# Patient Record
Sex: Male | Born: 1940 | Race: White | Hispanic: No | Marital: Married | Smoking: Never smoker
Health system: Southern US, Community
[De-identification: ages and names within clinical notes are randomized; demographics above are authoritative.]

## PROBLEM LIST (undated history)

## (undated) DIAGNOSIS — J189 Pneumonia, unspecified organism: Secondary | ICD-10-CM

## (undated) DIAGNOSIS — J939 Pneumothorax, unspecified: Secondary | ICD-10-CM

## (undated) DIAGNOSIS — I959 Hypotension, unspecified: Secondary | ICD-10-CM

## (undated) DIAGNOSIS — I213 ST elevation (STEMI) myocardial infarction of unspecified site: Secondary | ICD-10-CM

## (undated) DIAGNOSIS — I2510 Atherosclerotic heart disease of native coronary artery without angina pectoris: Secondary | ICD-10-CM

## (undated) HISTORY — PX: CERVICAL SPINE SURGERY: SHX589

---

## 2016-04-29 ENCOUNTER — Emergency Department (HOSPITAL_COMMUNITY): Payer: Medicare Other

## 2016-04-29 ENCOUNTER — Inpatient Hospital Stay (HOSPITAL_COMMUNITY)
Admission: EM | Admit: 2016-04-29 | Discharge: 2016-06-01 | DRG: 870 | Disposition: E | Payer: Medicare Other | Attending: Internal Medicine | Admitting: Internal Medicine

## 2016-04-29 ENCOUNTER — Encounter (HOSPITAL_COMMUNITY): Payer: Self-pay | Admitting: *Deleted

## 2016-04-29 DIAGNOSIS — E46 Unspecified protein-calorie malnutrition: Secondary | ICD-10-CM | POA: Diagnosis not present

## 2016-04-29 DIAGNOSIS — Z515 Encounter for palliative care: Secondary | ICD-10-CM | POA: Diagnosis not present

## 2016-04-29 DIAGNOSIS — I4892 Unspecified atrial flutter: Secondary | ICD-10-CM

## 2016-04-29 DIAGNOSIS — I252 Old myocardial infarction: Secondary | ICD-10-CM

## 2016-04-29 DIAGNOSIS — L89314 Pressure ulcer of right buttock, stage 4: Secondary | ICD-10-CM | POA: Diagnosis not present

## 2016-04-29 DIAGNOSIS — Z9911 Dependence on respirator [ventilator] status: Secondary | ICD-10-CM | POA: Diagnosis not present

## 2016-04-29 DIAGNOSIS — A419 Sepsis, unspecified organism: Secondary | ICD-10-CM | POA: Diagnosis present

## 2016-04-29 DIAGNOSIS — I482 Chronic atrial fibrillation, unspecified: Secondary | ICD-10-CM | POA: Diagnosis present

## 2016-04-29 DIAGNOSIS — I2510 Atherosclerotic heart disease of native coronary artery without angina pectoris: Secondary | ICD-10-CM | POA: Diagnosis present

## 2016-04-29 DIAGNOSIS — J90 Pleural effusion, not elsewhere classified: Secondary | ICD-10-CM | POA: Diagnosis present

## 2016-04-29 DIAGNOSIS — Z6827 Body mass index (BMI) 27.0-27.9, adult: Secondary | ICD-10-CM | POA: Diagnosis not present

## 2016-04-29 DIAGNOSIS — S129XXS Fracture of neck, unspecified, sequela: Secondary | ICD-10-CM

## 2016-04-29 DIAGNOSIS — S129XXA Fracture of neck, unspecified, initial encounter: Secondary | ICD-10-CM | POA: Diagnosis present

## 2016-04-29 DIAGNOSIS — Z7189 Other specified counseling: Secondary | ICD-10-CM | POA: Diagnosis present

## 2016-04-29 DIAGNOSIS — Y95 Nosocomial condition: Secondary | ICD-10-CM | POA: Diagnosis not present

## 2016-04-29 DIAGNOSIS — R000 Tachycardia, unspecified: Secondary | ICD-10-CM | POA: Diagnosis not present

## 2016-04-29 DIAGNOSIS — L89324 Pressure ulcer of left buttock, stage 4: Secondary | ICD-10-CM | POA: Diagnosis not present

## 2016-04-29 DIAGNOSIS — Z930 Tracheostomy status: Secondary | ICD-10-CM | POA: Diagnosis not present

## 2016-04-29 DIAGNOSIS — J151 Pneumonia due to Pseudomonas: Secondary | ICD-10-CM | POA: Diagnosis present

## 2016-04-29 DIAGNOSIS — I5032 Chronic diastolic (congestive) heart failure: Secondary | ICD-10-CM | POA: Diagnosis not present

## 2016-04-29 DIAGNOSIS — J9600 Acute respiratory failure, unspecified whether with hypoxia or hypercapnia: Secondary | ICD-10-CM

## 2016-04-29 DIAGNOSIS — S12500D Unspecified displaced fracture of sixth cervical vertebra, subsequent encounter for fracture with routine healing: Secondary | ICD-10-CM

## 2016-04-29 DIAGNOSIS — Z931 Gastrostomy status: Secondary | ICD-10-CM | POA: Diagnosis not present

## 2016-04-29 DIAGNOSIS — E876 Hypokalemia: Secondary | ICD-10-CM | POA: Diagnosis present

## 2016-04-29 DIAGNOSIS — W131XXD Fall from, out of or through bridge, subsequent encounter: Secondary | ICD-10-CM | POA: Diagnosis present

## 2016-04-29 DIAGNOSIS — S22019A Unspecified fracture of first thoracic vertebra, initial encounter for closed fracture: Secondary | ICD-10-CM | POA: Diagnosis present

## 2016-04-29 DIAGNOSIS — E1165 Type 2 diabetes mellitus with hyperglycemia: Secondary | ICD-10-CM | POA: Diagnosis not present

## 2016-04-29 DIAGNOSIS — D649 Anemia, unspecified: Secondary | ICD-10-CM | POA: Diagnosis not present

## 2016-04-29 DIAGNOSIS — I9589 Other hypotension: Secondary | ICD-10-CM | POA: Diagnosis present

## 2016-04-29 DIAGNOSIS — R6521 Severe sepsis with septic shock: Secondary | ICD-10-CM | POA: Diagnosis not present

## 2016-04-29 DIAGNOSIS — F411 Generalized anxiety disorder: Secondary | ICD-10-CM | POA: Diagnosis present

## 2016-04-29 DIAGNOSIS — A4152 Sepsis due to Pseudomonas: Secondary | ICD-10-CM | POA: Diagnosis not present

## 2016-04-29 DIAGNOSIS — J9621 Acute and chronic respiratory failure with hypoxia: Secondary | ICD-10-CM | POA: Diagnosis present

## 2016-04-29 DIAGNOSIS — G8220 Paraplegia, unspecified: Secondary | ICD-10-CM | POA: Diagnosis present

## 2016-04-29 DIAGNOSIS — J189 Pneumonia, unspecified organism: Secondary | ICD-10-CM

## 2016-04-29 DIAGNOSIS — Z66 Do not resuscitate: Secondary | ICD-10-CM | POA: Diagnosis not present

## 2016-04-29 DIAGNOSIS — R0600 Dyspnea, unspecified: Secondary | ICD-10-CM | POA: Diagnosis present

## 2016-04-29 DIAGNOSIS — S12600D Unspecified displaced fracture of seventh cervical vertebra, subsequent encounter for fracture with routine healing: Secondary | ICD-10-CM

## 2016-04-29 DIAGNOSIS — L89622 Pressure ulcer of left heel, stage 2: Secondary | ICD-10-CM | POA: Diagnosis present

## 2016-04-29 DIAGNOSIS — L8990 Pressure ulcer of unspecified site, unspecified stage: Secondary | ICD-10-CM | POA: Diagnosis present

## 2016-04-29 DIAGNOSIS — L89152 Pressure ulcer of sacral region, stage 2: Secondary | ICD-10-CM | POA: Diagnosis present

## 2016-04-29 HISTORY — DX: ST elevation (STEMI) myocardial infarction of unspecified site: I21.3

## 2016-04-29 HISTORY — DX: Pneumonia, unspecified organism: J18.9

## 2016-04-29 HISTORY — DX: Pneumothorax, unspecified: J93.9

## 2016-04-29 HISTORY — DX: Atherosclerotic heart disease of native coronary artery without angina pectoris: I25.10

## 2016-04-29 HISTORY — DX: Hypotension, unspecified: I95.9

## 2016-04-29 LAB — CBC WITH DIFFERENTIAL/PLATELET
Basophils Absolute: 0 10*3/uL (ref 0.0–0.1)
Basophils Relative: 0 %
EOS PCT: 0 %
Eosinophils Absolute: 0.1 10*3/uL (ref 0.0–0.7)
HEMATOCRIT: 26.5 % — AB (ref 39.0–52.0)
Hemoglobin: 7.9 g/dL — ABNORMAL LOW (ref 13.0–17.0)
LYMPHS ABS: 2.1 10*3/uL (ref 0.7–4.0)
LYMPHS PCT: 13 %
MCH: 25.7 pg — AB (ref 26.0–34.0)
MCHC: 29.8 g/dL — AB (ref 30.0–36.0)
MCV: 86.3 fL (ref 78.0–100.0)
MONO ABS: 0.8 10*3/uL (ref 0.1–1.0)
MONOS PCT: 5 %
NEUTROS ABS: 12.8 10*3/uL — AB (ref 1.7–7.7)
Neutrophils Relative %: 82 %
Platelets: 418 10*3/uL — ABNORMAL HIGH (ref 150–400)
RBC: 3.07 MIL/uL — ABNORMAL LOW (ref 4.22–5.81)
RDW: 15.9 % — AB (ref 11.5–15.5)
WBC: 15.7 10*3/uL — ABNORMAL HIGH (ref 4.0–10.5)

## 2016-04-29 LAB — I-STAT TROPONIN, ED: Troponin i, poc: 0.01 ng/mL (ref 0.00–0.08)

## 2016-04-29 LAB — COMPREHENSIVE METABOLIC PANEL
ALBUMIN: 1.7 g/dL — AB (ref 3.5–5.0)
ALT: 43 U/L (ref 17–63)
ANION GAP: 7 (ref 5–15)
AST: 37 U/L (ref 15–41)
Alkaline Phosphatase: 111 U/L (ref 38–126)
BUN: 25 mg/dL — AB (ref 6–20)
CO2: 33 mmol/L — AB (ref 22–32)
Calcium: 7.9 mg/dL — ABNORMAL LOW (ref 8.9–10.3)
Chloride: 94 mmol/L — ABNORMAL LOW (ref 101–111)
Creatinine, Ser: 0.46 mg/dL — ABNORMAL LOW (ref 0.61–1.24)
GFR calc Af Amer: >60 mL/min (ref >60)
GFR calc non Af Amer: >60 mL/min (ref >60)
GLUCOSE: 194 mg/dL — AB (ref 65–99)
POTASSIUM: 3.9 mmol/L (ref 3.5–5.1)
SODIUM: 134 mmol/L — AB (ref 135–145)
Total Bilirubin: 0.4 mg/dL (ref 0.3–1.2)
Total Protein: 6.5 g/dL (ref 6.5–8.1)

## 2016-04-29 LAB — URINALYSIS, ROUTINE W REFLEX MICROSCOPIC
Bilirubin Urine: NEGATIVE
GLUCOSE, UA: NEGATIVE mg/dL
HGB URINE DIPSTICK: NEGATIVE
KETONES UR: NEGATIVE mg/dL
Nitrite: NEGATIVE
PH: 7 (ref 5.0–8.0)
Protein, ur: 30 mg/dL — AB
Specific Gravity, Urine: 1.029 (ref 1.005–1.030)

## 2016-04-29 LAB — URINE MICROSCOPIC-ADD ON

## 2016-04-29 LAB — I-STAT CG4 LACTIC ACID, ED
Lactic Acid, Venous: 1.61 mmol/L (ref 0.5–1.9)
Lactic Acid, Venous: 1.6 mmol/L (ref 0.5–1.9)

## 2016-04-29 LAB — SEDIMENTATION RATE: Sed Rate: 130 mm/hr — ABNORMAL HIGH (ref 0–16)

## 2016-04-29 LAB — C-REACTIVE PROTEIN: CRP: 9.9 mg/dL — ABNORMAL HIGH (ref <1.0)

## 2016-04-29 MED ORDER — IOPAMIDOL (ISOVUE-370) INJECTION 76%
INTRAVENOUS | Status: AC
  Administered 2016-04-29: 100 mL via INTRAVENOUS
  Filled 2016-04-29: qty 100

## 2016-04-29 MED ORDER — SODIUM CHLORIDE 0.9 % IV BOLUS (SEPSIS)
1000.0000 mL | Freq: Once | INTRAVENOUS | Status: AC
  Administered 2016-04-29: 1000 mL via INTRAVENOUS

## 2016-04-29 MED ORDER — SODIUM CHLORIDE 0.9 % IV BOLUS (SEPSIS)
500.0000 mL | Freq: Once | INTRAVENOUS | Status: AC
  Administered 2016-04-29: 500 mL via INTRAVENOUS

## 2016-04-29 MED ORDER — PIPERACILLIN-TAZOBACTAM 3.375 G IVPB
3.3750 g | Freq: Three times a day (TID) | INTRAVENOUS | Status: DC
Start: 2016-04-30 — End: 2016-05-04
  Administered 2016-04-30 – 2016-05-04 (×13): 3.375 g via INTRAVENOUS
  Filled 2016-04-29 (×16): qty 50

## 2016-04-29 MED ORDER — NOREPINEPHRINE BITARTRATE 1 MG/ML IV SOLN
0.0000 ug/min | Freq: Once | INTRAVENOUS | Status: AC
  Administered 2016-04-29: 10 ug/min via INTRAVENOUS
  Filled 2016-04-29: qty 4

## 2016-04-29 MED ORDER — VANCOMYCIN HCL IN DEXTROSE 1-5 GM/200ML-% IV SOLN
1000.0000 mg | Freq: Two times a day (BID) | INTRAVENOUS | Status: AC
  Administered 2016-04-30 – 2016-05-01 (×4): 1000 mg via INTRAVENOUS
  Filled 2016-04-29 (×6): qty 200

## 2016-04-29 MED ORDER — PIPERACILLIN-TAZOBACTAM 3.375 G IVPB 30 MIN
3.3750 g | Freq: Once | INTRAVENOUS | Status: AC
  Administered 2016-04-29: 3.375 g via INTRAVENOUS
  Filled 2016-04-29: qty 50

## 2016-04-29 MED ORDER — VANCOMYCIN HCL IN DEXTROSE 1-5 GM/200ML-% IV SOLN
1000.0000 mg | Freq: Once | INTRAVENOUS | Status: AC
  Administered 2016-04-29: 1000 mg via INTRAVENOUS
  Filled 2016-04-29: qty 200

## 2016-04-29 NOTE — ED Notes (Signed)
Called pharmacy for Levophed

## 2016-04-29 NOTE — ED Notes (Signed)
Patient transported to CT 

## 2016-04-29 NOTE — Progress Notes (Signed)
Pharmacy Antibiotic Note  Scott Lawrence is a 75 y.o. male admitted on 08/07/16 with sepsis.  Pharmacy has been consulted for vancomycin and zosyn dosing.  Pt received vancomycin 1g and zosyn 3.375g IV once in the ED.  Plan: Vancomycin 1g IV every 12 hours.  Goal trough 15-20 mcg/mL. Zosyn 3.375g IV q8h (4 hour infusion).  Monitor culture data, renal function and clinical course VT at SS prn  Weight: 180 lb (81.647 kg)  No data recorded.  No results for input(s): WBC, CREATININE, LATICACIDVEN, VANCOTROUGH, VANCOPEAK, VANCORANDOM, GENTTROUGH, GENTPEAK, GENTRANDOM, TOBRATROUGH, TOBRAPEAK, TOBRARND, AMIKACINPEAK, AMIKACINTROU, AMIKACIN in the last 168 hours.  CrCl cannot be calculated (Unknown ideal weight.).    No Known Allergies  Antimicrobials this admission: Vanc 6/29 >>  Zosyn 6/29 >>   Dose adjustments this admission: n/a  Microbiology results:  BCx:   UCx:    Sputum:    MRSA PCR:    Arlean Hoppingorey M. Newman PiesBall, PharmD, BCPS Clinical Pharmacist Pager [phone] 08/07/16 6:31 PM

## 2016-04-29 NOTE — Progress Notes (Signed)
RT NOTE:  Pt transported to CT and back without complication.

## 2016-04-29 NOTE — ED Provider Notes (Signed)
CSN: [id number]     Arrival date & time 04/05/2016  1802 History   First MD Initiated Contact with Patient 04/01/2016 1805     Chief Complaint  Patient presents with  . Tachycardia     (Consider location/radiation/quality/duration/timing/severity/associated sxs/prior Treatment) HPI Patient presents from facility given hypotension. He does have a history of hypotension secondary to spinal injury. 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 Patient does have G2, trach, and fully. He is trach dependent. He denies any shortness of breath or chest pain but does have this history of STEMI. He does not have good sensation secondary to his cervical cord injury. 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 He is also had multiple pneumonias. The hypotension is severe. Patient received fluids in route. He is on Midodrine for blood pressure but review of records reveals the patient is not typically this hypotensive (70s systolic).  Past Medical History  Diagnosis Date  . Coronary artery disease   . STEMI (ST elevation myocardial infarction) (HCC)   . Pneumothorax   . Hypotension   . Pneumonia    Past Surgical History  Procedure Laterality Date  . Cervical spine surgery     No family history on file. Social History  Substance Use Topics  . Smoking status: Unknown If Ever Smoked  . Smokeless tobacco: None  . Alcohol Use: No    Review of Systems  Constitutional: Negative for fever.  Allergic/Immunologic: Negative for immunocompromised state.  All other systems reviewed and are negative.     Allergies  Review of patient's allergies indicates no known allergies.  Home Medications   Prior to Admission medications   Medication Sig Start Date End Date Taking? Authorizing Provider  acetaminophen (TYLENOL) 500 MG tablet 500 mg by PEG Tube route every 6 (six) hours as needed for mild pain or fever ((FEVER OF >100)).   Yes Historical Provider, MD   Calcium Carbonate-Vitamin D (CALCIUM-VITAMIN D) 500-200 MG-UNIT tablet 2 tablets by PEG Tube route at bedtime.   Yes Historical Provider, MD  chlorhexidine (PERIDEX) 0.12 % solution Use as directed 15 mLs in the mouth or throat 2 (two) times daily. For attention to tracheostomy   Yes Historical Provider, MD  clonazePAM (KLONOPIN) 0.5 MG tablet 0.25 mg by PEG Tube route every 8 (eight) hours as needed (for agitation).   Yes Historical Provider, MD  docusate sodium (COLACE) 100 MG capsule 100 mg by PEG Tube route 2 (two) times daily. HOLD FOR LOOSE STOOLS   Yes Historical Provider, MD  famotidine (PEPCID) 20 MG tablet 20 mg by PEG Tube route 2 (two) times daily.   Yes Historical Provider, MD  fentaNYL (DURAGESIC - DOSED MCG/HR) 25 MCG/HR patch Place 25 mcg onto the skin every 3 (three) days.   Yes Historical Provider, MD  Ferrous Sulfate 5 MG/20ML LIQD 300 mg by PEG Tube route every morning. For supplement   Yes Historical Provider, MD  furosemide (LASIX) 40 MG tablet 40 mg by PEG Tube route every morning.   Yes Historical Provider, MD  Gabapentin 300 MG/6ML SOLN 300 mg by PEG Tube route 3 (three) times daily. FOR PAIN   Yes Historical Provider, MD  guaiFENesin (ROBITUSSIN) 100 MG/5ML liquid 200 mg by PEG Tube route every 6 (six) hours as needed for cough.   Yes Historical Provider, MD  insulin aspart (NOVOLOG) 100 UNIT/ML injection Inject 3-15 Units into the skin 2 (two) times daily. Per sliding scale: "151-200 = 3  units; 201-300 = 5 units; 301-400 = 10 units; 401-500 = 15 units; 501 and up give 20 units and repeat BS every hour <200, subq every 12 hours for dm"   Yes Historical Provider, MD  ipratropium-albuterol (DUONEB) 0.5-2.5 (3) MG/3ML SOLN 3 mLs by Tracheal Tube route every 6 (six) hours. FOR SHORTNESS OF BREATH RELATED TO ACUTE AND CHRONIC RESPIRATORY FAILURE WITH HYPOXIA   Yes Historical Provider, MD  midodrine (PROAMATINE) 10 MG tablet 10 mg by PEG Tube route 3 (three) times daily. HOLD FOR  SBP >100   Yes Historical Provider, MD  oxyCODONE (OXY IR/ROXICODONE) 5 MG immediate release tablet 5 mg by PEG Tube route every 4 (four) hours as needed for moderate pain.   Yes Historical Provider, MD  Sodium Chloride, Inhalant, (HYPER-SAL) 7 % NEBU 1 ampule by Tracheal Tube route every 6 (six) hours. RELATED TO ACUTE AND CHRONIC RESPIRATORY FAILURE WITH HYPOXIA (GIVE WITH DUONEB)   Yes Historical Provider, MD  tamsulosin (FLOMAX) 0.4 MG CAPS capsule 0.4 mg by PEG Tube route every morning.   Yes Historical Provider, MD  UNABLE TO FIND Protein Modular Powder: One scoop via PEG-Tube every six hours   Yes Historical Provider, MD  UNABLE TO FIND Enteral Feed Order- Every 4 hours enteral nutrition via bolus: Full strength Osmolite 1.5 Cal at frequency 260 ml's every four hours   Yes Historical Provider, MD  vitamin C (ASCORBIC ACID) 500 MG tablet 500 mg by PEG Tube route every morning.   Yes Historical Provider, MD  Zinc Sulfate 220 (50 Zn) MG TABS 220 mg by PEG Tube route 2 (two) times daily.   Yes Historical Provider, MD   BP 79/54 mmHg  Pulse 153  Resp 18  Wt 81.647 kg  SpO2 100% Physical Exam  Constitutional: He is oriented to person, place, and time. He appears well-developed and well-nourished. No distress.  HENT:  Head: Normocephalic and atraumatic.  Eyes: Conjunctivae are normal. Pupils are equal, round, and reactive to light. Right eye exhibits no discharge. Left eye exhibits no discharge.  Neck: Normal range of motion. Neck supple.  Cardiovascular: Normal rate and regular rhythm.   No murmur heard. Pulmonary/Chest: Effort normal and breath sounds normal. No respiratory distress.  Abdominal: Soft. Bowel sounds are normal. He exhibits no distension and no mass. There is no tenderness. There is no rebound and no guarding.  Genitourinary:  Foul-smelling sacral decubitus ulcers with purulent discharge Foley with clear urine   Musculoskeletal: He exhibits no edema.  Neurological: He is  alert and oriented to person, place, and time.  Unable to move upper or lower extremities  Skin: Skin is warm. He is not diaphoretic.  Psychiatric: He has a normal mood and affect.  Nursing note and vitals reviewed.   ED Course  Procedures (including critical care time) Labs Review Labs Reviewed  COMPREHENSIVE METABOLIC PANEL - Abnormal; Notable for the following:    Sodium 134 (*)    Chloride 94 (*)    CO2 33 (*)    Glucose, Bld 194 (*)    BUN 25 (*)    Creatinine, Ser 0.46 (*)    Calcium 7.9 (*)    Albumin 1.7 (*)    All other components within normal limits  CBC WITH DIFFERENTIAL/PLATELET - Abnormal; Notable for the following:    WBC 15.7 (*)    RBC 3.07 (*)    Hemoglobin 7.9 (*)    HCT 26.5 (*)    MCH 25.7 (*)    MCHC  29.8 (*)    RDW 15.9 (*)    Platelets 418 (*)    Neutro Abs 12.8 (*)    All other components within normal limits  URINALYSIS, ROUTINE W REFLEX MICROSCOPIC (NOT AT [facility]) - Abnormal; Notable for the following:    Color, Urine AMBER (*)    APPearance CLOUDY (*)    Protein, ur 30 (*)    Leukocytes, UA SMALL (*)    All other components within normal limits  URINE MICROSCOPIC-ADD ON - Abnormal; Notable for the following:    Squamous Epithelial / LPF 0-5 (*)    Bacteria, UA RARE (*)    All other components within normal limits  SEDIMENTATION RATE - Abnormal; Notable for the following:    Sed Rate 130 (*)    All other components within normal limits  C-REACTIVE PROTEIN - Abnormal; Notable for the following:    CRP 9.9 (*)    All other components within normal limits  CULTURE, BLOOD (ROUTINE X 2)  CULTURE, BLOOD (ROUTINE X 2)  URINE CULTURE  TSH  I-STAT CG4 LACTIC ACID, ED  I-STAT TROPOININ, ED  I-STAT CG4 LACTIC ACID, ED  I-STAT TROPOININ, ED    Imaging Review Ct Angio Chest Pe W/cm &/or Wo Cm  04/05/2016  CLINICAL DATA:  Fever. EXAM: CT ANGIOGRAPHY CHEST WITH CONTRAST TECHNIQUE: Multidetector CT imaging of the chest was performed using the  standard protocol during bolus administration of intravenous contrast. Multiplanar CT image reconstructions and MIPs were obtained to evaluate the vascular anatomy. CONTRAST:  100 mL of Isovue 370 COMPARISON:  None. FINDINGS: Tracheostomy tube in place. The trachea and mainstem bronchi are normal. There is partial opacification of the right bronchus intermedius and right lower lobe bronchi. The remainder of the bronchi are normal. No pneumothorax. There is a right-sided pleural effusion with significant underlying opacity. More mild opacity is seen in the left base which could represent atelectasis or another smaller infiltrate. No suspicious nodules or other infiltrates. No overt edema. There is adenopathy in the mediastinum with a representative node anterior to the carina on image 66 measuring 14.5 mm. No left-sided pleural effusion. No pericardial effusion. Coronary artery calcifications are noted. The heart is mildly enlarged. The thoracic aorta is non aneurysmal with no dissection. Mild atherosclerotic changes are seen. Evaluation of right lower lobe pulmonary arteries is limited due to the significant right lower lobe pulmonary opacity. However, within this limitation, no pulmonary emboli are identified. No abnormalities identified on limited views the upper abdomen. A PEG tube is only partially visualized and not well assessed. Visualized bones demonstrate pedicle rods and screws in the cervical spine. Mild anterior wedging of an upper thoracic vertebral body is age-indeterminate but favored to be chronic. A healed old sternal fracture is identified. Review of the MIP images confirms the above findings. IMPRESSION: 1. No pulmonary emboli. 2. Moderate size right-sided pleural effusion with significant underlying opacity. Recommend follow-up to resolution. More mild opacity in the left base as described above. 3. Atherosclerotic change in the thoracic aorta. 4. Adenopathy in the mediastinum is nonspecific but  may be reactive. Recommend attention on follow-up. Electronically Signed   By: Gerome Samavid  Williams III M.D   On: 04/01/2016 23:54   Dg Pelvis Portable  04/23/2016  CLINICAL DATA:  75 year old male with prednisone interval. Evaluate for osteomyelitis. EXAM: PORTABLE PELVIS 1-2 VIEWS COMPARISON:  None. FINDINGS: A linear lucency through the left inferior pubic ramus may be artifactual or represent a nondisplaced fracture. Clinical correlations CT may provide  better evaluation if there is high clinical concern for fracture. The bones are osteopenic. 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 There is no dislocation. There is no bony erosion or periosteal reaction to suggest osteomyelitis. Evaluation for osteomyelitis is however very limited on this single view radiograph. MRI or a white blood cell nuclear scan may provide better evaluation if there is high clinical concern for osteomyelitis. A catheter is noted over the bladder. Round density within the pelvis may send a fecal ball within the rectum or the large prostate gland. IMPRESSION: Artifact versus a nondisplaced fracture of the left inferior pelvic ramus. Clinical correlation is recommended. No other acute findings identified. Electronically Signed   By: Elgie CollardArash  Radparvar M.D.   On: 01/18/2016 21:02   Dg Chest Port 1 View  06-01-2016  CLINICAL DATA:  Tachycardia and hypotension for 1 day EXAM: PORTABLE CHEST 1 VIEW COMPARISON:  None. FINDINGS: There is airspace consolidation throughout the right mid and lower lung zones consistent with pneumonia. There is a small right effusion as well. Left lung is clear. Heart is upper normal in size with pulmonary vascularity within normal limits. No adenopathy. There are foci of atherosclerotic calcification in the aorta. Tracheostomy tip is 6.8 cm above the carina. There is postoperative change in the lower cervical and upper thoracic regions. IMPRESSION: Tracheostomy as described. No pneumothorax. Extensive  airspace consolidation in the right mid and lower lung zones with small right effusion. Left lung is clear. Aortic atherosclerosis. Electronically Signed   By: Bretta BangWilliam  Woodruff III M.D.   On: 01/18/2016 19:23   I have personally reviewed and evaluated these images and lab results as part of my medical decision-making.   EKG Interpretation   Date/Time:  Thursday April 29 2016 18:07:42 EDT Ventricular Rate:  163 PR Interval:    QRS Duration: 83 QT Interval:  250 QTC Calculation: 412 R Axis:   118 Text Interpretation:  Supraventricular tachycardia Right axis deviation  [id number] pattern No acute changes Nonspecific ST and T wave abnormality  Confirmed by NANAVATI, MD, ANKIT [phone]([id number]) on 06-01-2016 11:03:49 PM      MDM   Final diagnoses:  Tachycardia  Atrial flutter, unspecified type (HCC)   New-onset of atrial flutter after adenosine given at bedside. Likely secondary to sepsis from sacral decubitus wounds. Patient started on pressors after systolics in the 7550s. Lactic acid however is stable and not elevated. CTA obtained due to concern of PE but negative. There is fluid collection, possible pneumonia patient has had multiple in the past was drug-resistant organisms. Spoke with cardiology, they recommended the patient be continued on NE and amiodarone. They recommended against cardioversion given the underlying source likely to be sepsis (spoke with nursing home, tachycardia started approx at 2pm today). Initially considered digoxin and neo but after further consideration and the fact the patient is septic, they will like to continue nor NE and amiodarone. Heparin gtt started for CHAD2DS2VASC score of 2 (age, DM).   Attempted to call patient's spouse and son multiple times with no success. Patient authorized us leaving a message in son's voicemail.  Spoke with Dr. Laverna PeaceGilbord. Patient will be admitted to the unit.  Pt now in sinus rhythm. Will dc amiodarone. Heparin gtt continued. Will  admit.   Sidney AceAlison Charruf Socrates Cahoon, MD 04/30/16 [phone]  Derwood KaplanAnkit Nanavati, MD 04/30/16 [phone]

## 2016-04-29 NOTE — ED Notes (Signed)
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

## 2016-04-30 ENCOUNTER — Inpatient Hospital Stay (HOSPITAL_COMMUNITY): Payer: Medicare Other

## 2016-04-30 ENCOUNTER — Encounter (HOSPITAL_COMMUNITY): Payer: Self-pay | Admitting: *Deleted

## 2016-04-30 DIAGNOSIS — I2510 Atherosclerotic heart disease of native coronary artery without angina pectoris: Secondary | ICD-10-CM | POA: Diagnosis present

## 2016-04-30 DIAGNOSIS — M4852XA Collapsed vertebra, not elsewhere classified, cervical region, initial encounter for fracture: Secondary | ICD-10-CM | POA: Diagnosis not present

## 2016-04-30 DIAGNOSIS — L89324 Pressure ulcer of left buttock, stage 4: Secondary | ICD-10-CM | POA: Diagnosis present

## 2016-04-30 DIAGNOSIS — I4892 Unspecified atrial flutter: Secondary | ICD-10-CM | POA: Diagnosis not present

## 2016-04-30 DIAGNOSIS — Z66 Do not resuscitate: Secondary | ICD-10-CM | POA: Diagnosis not present

## 2016-04-30 DIAGNOSIS — J948 Other specified pleural conditions: Secondary | ICD-10-CM | POA: Diagnosis not present

## 2016-04-30 DIAGNOSIS — Z9911 Dependence on respirator [ventilator] status: Secondary | ICD-10-CM | POA: Diagnosis not present

## 2016-04-30 DIAGNOSIS — E46 Unspecified protein-calorie malnutrition: Secondary | ICD-10-CM | POA: Insufficient documentation

## 2016-04-30 DIAGNOSIS — G8220 Paraplegia, unspecified: Secondary | ICD-10-CM | POA: Insufficient documentation

## 2016-04-30 DIAGNOSIS — Z9889 Other specified postprocedural states: Secondary | ICD-10-CM | POA: Insufficient documentation

## 2016-04-30 DIAGNOSIS — R6521 Severe sepsis with septic shock: Secondary | ICD-10-CM | POA: Diagnosis present

## 2016-04-30 DIAGNOSIS — J9611 Chronic respiratory failure with hypoxia: Secondary | ICD-10-CM | POA: Diagnosis not present

## 2016-04-30 DIAGNOSIS — R0600 Dyspnea, unspecified: Secondary | ICD-10-CM | POA: Diagnosis not present

## 2016-04-30 DIAGNOSIS — Z930 Tracheostomy status: Secondary | ICD-10-CM

## 2016-04-30 DIAGNOSIS — S12500D Unspecified displaced fracture of sixth cervical vertebra, subsequent encounter for fracture with routine healing: Secondary | ICD-10-CM | POA: Diagnosis not present

## 2016-04-30 DIAGNOSIS — Z6827 Body mass index (BMI) 27.0-27.9, adult: Secondary | ICD-10-CM | POA: Diagnosis not present

## 2016-04-30 DIAGNOSIS — J9610 Chronic respiratory failure, unspecified whether with hypoxia or hypercapnia: Secondary | ICD-10-CM

## 2016-04-30 DIAGNOSIS — F411 Generalized anxiety disorder: Secondary | ICD-10-CM | POA: Diagnosis not present

## 2016-04-30 DIAGNOSIS — E876 Hypokalemia: Secondary | ICD-10-CM | POA: Diagnosis present

## 2016-04-30 DIAGNOSIS — W131XXD Fall from, out of or through bridge, subsequent encounter: Secondary | ICD-10-CM | POA: Diagnosis present

## 2016-04-30 DIAGNOSIS — J189 Pneumonia, unspecified organism: Secondary | ICD-10-CM | POA: Diagnosis not present

## 2016-04-30 DIAGNOSIS — I340 Nonrheumatic mitral (valve) insufficiency: Secondary | ICD-10-CM | POA: Diagnosis not present

## 2016-04-30 DIAGNOSIS — R000 Tachycardia, unspecified: Secondary | ICD-10-CM | POA: Diagnosis present

## 2016-04-30 DIAGNOSIS — Y95 Nosocomial condition: Secondary | ICD-10-CM | POA: Diagnosis present

## 2016-04-30 DIAGNOSIS — J90 Pleural effusion, not elsewhere classified: Secondary | ICD-10-CM | POA: Diagnosis not present

## 2016-04-30 DIAGNOSIS — J151 Pneumonia due to Pseudomonas: Secondary | ICD-10-CM | POA: Diagnosis present

## 2016-04-30 DIAGNOSIS — Z515 Encounter for palliative care: Secondary | ICD-10-CM | POA: Diagnosis not present

## 2016-04-30 DIAGNOSIS — A4152 Sepsis due to Pseudomonas: Secondary | ICD-10-CM | POA: Diagnosis present

## 2016-04-30 DIAGNOSIS — I252 Old myocardial infarction: Secondary | ICD-10-CM | POA: Diagnosis not present

## 2016-04-30 DIAGNOSIS — A419 Sepsis, unspecified organism: Secondary | ICD-10-CM | POA: Diagnosis not present

## 2016-04-30 DIAGNOSIS — J9621 Acute and chronic respiratory failure with hypoxia: Secondary | ICD-10-CM | POA: Diagnosis present

## 2016-04-30 DIAGNOSIS — Z931 Gastrostomy status: Secondary | ICD-10-CM | POA: Diagnosis not present

## 2016-04-30 DIAGNOSIS — I959 Hypotension, unspecified: Secondary | ICD-10-CM

## 2016-04-30 DIAGNOSIS — L89314 Pressure ulcer of right buttock, stage 4: Secondary | ICD-10-CM | POA: Diagnosis present

## 2016-04-30 DIAGNOSIS — I9589 Other hypotension: Secondary | ICD-10-CM | POA: Diagnosis present

## 2016-04-30 DIAGNOSIS — D649 Anemia, unspecified: Secondary | ICD-10-CM | POA: Diagnosis present

## 2016-04-30 DIAGNOSIS — I482 Chronic atrial fibrillation: Secondary | ICD-10-CM | POA: Diagnosis present

## 2016-04-30 DIAGNOSIS — I5032 Chronic diastolic (congestive) heart failure: Secondary | ICD-10-CM | POA: Diagnosis present

## 2016-04-30 DIAGNOSIS — Z7189 Other specified counseling: Secondary | ICD-10-CM | POA: Diagnosis not present

## 2016-04-30 DIAGNOSIS — E1165 Type 2 diabetes mellitus with hyperglycemia: Secondary | ICD-10-CM | POA: Diagnosis present

## 2016-04-30 LAB — BASIC METABOLIC PANEL
ANION GAP: 5 (ref 5–15)
BUN: 19 mg/dL (ref 6–20)
CALCIUM: 7.5 mg/dL — AB (ref 8.9–10.3)
CO2: 29 mmol/L (ref 22–32)
Chloride: 100 mmol/L — ABNORMAL LOW (ref 101–111)
Creatinine, Ser: 0.36 mg/dL — ABNORMAL LOW (ref 0.61–1.24)
Glucose, Bld: 119 mg/dL — ABNORMAL HIGH (ref 65–99)
POTASSIUM: 3.4 mmol/L — AB (ref 3.5–5.1)
SODIUM: 134 mmol/L — AB (ref 135–145)

## 2016-04-30 LAB — I-STAT ARTERIAL BLOOD GAS, ED
ACID-BASE EXCESS: 8 mmol/L — AB (ref 0.0–2.0)
Bicarbonate: 32.1 mEq/L — ABNORMAL HIGH (ref 20.0–24.0)
O2 Saturation: 93 %
TCO2: 33 mmol/L (ref 0–100)
pCO2 arterial: 44 mmHg (ref 35.0–45.0)
pH, Arterial: 7.472 — ABNORMAL HIGH (ref 7.350–7.450)
pO2, Arterial: 64 mmHg — ABNORMAL LOW (ref 80.0–100.0)

## 2016-04-30 LAB — BLOOD GAS, ARTERIAL
ACID-BASE EXCESS: 6.2 mmol/L — AB (ref 0.0–2.0)
Bicarbonate: 30.8 mEq/L — ABNORMAL HIGH (ref 20.0–24.0)
Drawn by: 41875
FIO2: 0.4
LHR: 16 {breaths}/min
MECHVT: 500 mL
O2 Saturation: 96.9 %
PEEP/CPAP: 5 cmH2O
PO2 ART: 99 mmHg (ref 80.0–100.0)
Patient temperature: 98.6
TCO2: 32.3 mmol/L (ref 0–100)
pCO2 arterial: 50.1 mmHg — ABNORMAL HIGH (ref 35.0–45.0)
pH, Arterial: 7.406 (ref 7.350–7.450)

## 2016-04-30 LAB — GLUCOSE, CAPILLARY
GLUCOSE-CAPILLARY: 110 mg/dL — AB (ref 65–99)
GLUCOSE-CAPILLARY: 114 mg/dL — AB (ref 65–99)
GLUCOSE-CAPILLARY: 117 mg/dL — AB (ref 65–99)
Glucose-Capillary: 123 mg/dL — ABNORMAL HIGH (ref 65–99)
Glucose-Capillary: 116 mg/dL — ABNORMAL HIGH (ref 65–99)
Glucose-Capillary: 113 mg/dL — ABNORMAL HIGH (ref 65–99)

## 2016-04-30 LAB — CBC
HCT: 27.3 % — ABNORMAL LOW (ref 39.0–52.0)
HEMOGLOBIN: 8.2 g/dL — AB (ref 13.0–17.0)
MCH: 26.3 pg (ref 26.0–34.0)
MCHC: 30 g/dL (ref 30.0–36.0)
MCV: 87.5 fL (ref 78.0–100.0)
PLATELETS: 429 10*3/uL — AB (ref 150–400)
RBC: 3.12 MIL/uL — ABNORMAL LOW (ref 4.22–5.81)
RDW: 15.9 % — AB (ref 11.5–15.5)
WBC: 15.2 10*3/uL — ABNORMAL HIGH (ref 4.0–10.5)

## 2016-04-30 LAB — CORTISOL: CORTISOL PLASMA: 16.3 ug/dL

## 2016-04-30 LAB — TSH: TSH: 4.115 u[IU]/mL (ref 0.350–4.500)

## 2016-04-30 LAB — PHOSPHORUS: Phosphorus: 3.2 mg/dL (ref 2.5–4.6)

## 2016-04-30 LAB — MRSA PCR SCREENING: MRSA by PCR: NEGATIVE

## 2016-04-30 LAB — I-STAT TROPONIN, ED: TROPONIN I, POC: 0.04 ng/mL (ref 0.00–0.08)

## 2016-04-30 LAB — PROCALCITONIN: PROCALCITONIN: 0.18 ng/mL

## 2016-04-30 LAB — MAGNESIUM: MAGNESIUM: 1.8 mg/dL (ref 1.7–2.4)

## 2016-04-30 LAB — HEPARIN LEVEL (UNFRACTIONATED)

## 2016-04-30 MED ORDER — MIDODRINE HCL 5 MG PO TABS
10.0000 mg | ORAL_TABLET | Freq: Three times a day (TID) | ORAL | Status: DC
  Administered 2016-04-30 – 2016-05-05 (×16): 10 mg
  Filled 2016-04-30 (×17): qty 2

## 2016-04-30 MED ORDER — PHENYLEPHRINE HCL 10 MG/ML IJ SOLN
0.0000 ug/min | Freq: Once | INTRAVENOUS | Status: DC
  Filled 2016-04-30: qty 1

## 2016-04-30 MED ORDER — SODIUM CHLORIDE 0.9 % IV BOLUS (SEPSIS)
1000.0000 mL | Freq: Once | INTRAVENOUS | Status: AC
  Administered 2016-04-30: 1000 mL via INTRAVENOUS

## 2016-04-30 MED ORDER — HEPARIN BOLUS VIA INFUSION
3000.0000 [IU] | Freq: Once | INTRAVENOUS | Status: AC
  Administered 2016-04-30: 3000 [IU] via INTRAVENOUS
  Filled 2016-04-30: qty 3000

## 2016-04-30 MED ORDER — SODIUM CHLORIDE 0.9 % IV SOLN: Freq: Once | INTRAVENOUS | Status: AC

## 2016-04-30 MED ORDER — HEPARIN BOLUS VIA INFUSION
3000.0000 [IU] | Freq: Once | INTRAVENOUS | Status: AC
Start: 2016-04-30 — End: 2016-04-30
  Administered 2016-04-30: 3000 [IU] via INTRAVENOUS
  Filled 2016-04-30: qty 3000

## 2016-04-30 MED ORDER — AMIODARONE LOAD VIA INFUSION
150.0000 mg | Freq: Once | INTRAVENOUS | Status: DC
  Filled 2016-04-30: qty 83.34

## 2016-04-30 MED ORDER — VITAL AF 1.2 CAL PO LIQD
1000.0000 mL | ORAL | Status: AC
  Administered 2016-04-30 (×2): 1000 mL
  Filled 2016-04-30 (×3): qty 1000

## 2016-04-30 MED ORDER — SODIUM CHLORIDE 0.9 % IV SOLN
INTRAVENOUS | Status: DC
  Administered 2016-04-30 – 2016-05-08 (×8): via INTRAVENOUS

## 2016-04-30 MED ORDER — CHLORHEXIDINE GLUCONATE 0.12% ORAL RINSE (MEDLINE KIT)
15.0000 mL | Freq: Two times a day (BID) | OROMUCOSAL | Status: DC
  Administered 2016-04-30 – 2016-05-09 (×18): 15 mL via OROMUCOSAL

## 2016-04-30 MED ORDER — DIGOXIN 0.25 MG/ML IJ SOLN: 0.5000 mg | Freq: Once | INTRAMUSCULAR | Status: DC

## 2016-04-30 MED ORDER — FAMOTIDINE 20 MG PO TABS
20.0000 mg | ORAL_TABLET | Freq: Two times a day (BID) | ORAL | Status: DC
  Administered 2016-04-30 – 2016-05-09 (×20): 20 mg
  Filled 2016-04-30 (×20): qty 1

## 2016-04-30 MED ORDER — INSULIN ASPART 100 UNIT/ML ~~LOC~~ SOLN
0.0000 [IU] | SUBCUTANEOUS | Status: DC
  Administered 2016-05-01 – 2016-05-02 (×6): 2 [IU] via SUBCUTANEOUS
  Administered 2016-05-02: 3 [IU] via SUBCUTANEOUS

## 2016-04-30 MED ORDER — PANTOPRAZOLE SODIUM 40 MG PO PACK: 40.0000 mg | PACK | Freq: Every day | ORAL | Status: DC

## 2016-04-30 MED ORDER — LEVALBUTEROL HCL 0.63 MG/3ML IN NEBU
0.6300 mg | INHALATION_SOLUTION | RESPIRATORY_TRACT | Status: DC | PRN
  Filled 2016-04-30: qty 3

## 2016-04-30 MED ORDER — PRO-STAT SUGAR FREE PO LIQD
30.0000 mL | Freq: Three times a day (TID) | ORAL | Status: AC
  Administered 2016-04-30 – 2016-05-01 (×5): 30 mL
  Filled 2016-04-30 (×5): qty 30

## 2016-04-30 MED ORDER — HEPARIN (PORCINE) IN NACL 100-0.45 UNIT/ML-% IJ SOLN
2500.0000 [IU]/h | INTRAMUSCULAR | Status: AC
  Administered 2016-04-30: 1100 [IU]/h via INTRAVENOUS
  Administered 2016-04-30: 1400 [IU]/h via INTRAVENOUS
  Administered 2016-05-01: 1850 [IU]/h via INTRAVENOUS
  Administered 2016-05-01: 2150 [IU]/h via INTRAVENOUS
  Administered 2016-05-02: 2500 [IU]/h via INTRAVENOUS
  Filled 2016-04-30 (×5): qty 250

## 2016-04-30 MED ORDER — ANTISEPTIC ORAL RINSE SOLUTION (CORINZ)
7.0000 mL | Freq: Four times a day (QID) | OROMUCOSAL | Status: DC
  Administered 2016-04-30 – 2016-05-10 (×36): 7 mL via OROMUCOSAL

## 2016-04-30 MED ORDER — ADENOSINE 6 MG/2ML IV SOLN
6.0000 mg | Freq: Once | INTRAVENOUS | Status: AC
  Administered 2016-04-30: 6 mg via INTRAVENOUS
  Filled 2016-04-30: qty 2

## 2016-04-30 MED ORDER — ONDANSETRON HCL 4 MG/2ML IJ SOLN
4.0000 mg | Freq: Four times a day (QID) | INTRAMUSCULAR | Status: DC | PRN
  Administered 2016-04-30 – 2016-05-04 (×4): 4 mg via INTRAVENOUS
  Filled 2016-04-30 (×4): qty 2

## 2016-04-30 MED ORDER — VITAL HIGH PROTEIN PO LIQD: 1000.0000 mL | ORAL | Status: DC

## 2016-04-30 MED ORDER — SODIUM CHLORIDE 0.9 % IV SOLN: 250.0000 mL | INTRAVENOUS | Status: DC | PRN

## 2016-04-30 MED ORDER — AMIODARONE HCL IN DEXTROSE 360-4.14 MG/200ML-% IV SOLN: 60.0000 mg/h | Freq: Once | INTRAVENOUS | Status: DC

## 2016-04-30 MED ORDER — MIDODRINE HCL 5 MG PO TABS: 10.0000 mg | ORAL_TABLET | Freq: Three times a day (TID) | ORAL | Status: DC

## 2016-04-30 MED ORDER — COLLAGENASE 250 UNIT/GM EX OINT
TOPICAL_OINTMENT | Freq: Every day | CUTANEOUS | Status: DC
  Administered 2016-04-30 – 2016-05-04 (×5): via TOPICAL
  Administered 2016-05-07: 1 via TOPICAL
  Filled 2016-04-30 (×4): qty 30

## 2016-04-30 MED ORDER — ACETAMINOPHEN 500 MG PO TABS: 1000.0000 mg | ORAL_TABLET | Freq: Once | ORAL | Status: DC

## 2016-04-30 NOTE — Progress Notes (Signed)
Patient transferred from 2S. Resting comfortably, vitals stable, no complaint of pain. Fluids running as ordered. Tube feed infusing with increase in rate at 1900. Wife is at the bedside. Documentation complete, patient comfortable, no questions or concerns at this time. Call bell in reach, will continue to monitor.

## 2016-04-30 NOTE — Progress Notes (Signed)
ANTICOAGULATION CONSULT NOTE - Initial Consult  Pharmacy Consult for heparin Indication: atrial fibrillation  No Known Allergies  Patient Measurements: Weight: 180 lb (81.647 kg)  Vital Signs: BP: 108/67 mmHg (06/29 2345) Pulse Rate: 161 (06/29 2345)  Labs:  Recent Labs  Feb 11, 2016 1825  HGB 7.9*  HCT 26.5*  PLT 418*  CREATININE 0.46*     Medical History: Past Medical History  Diagnosis Date  . Coronary artery disease   . STEMI (ST elevation myocardial infarction) (HCC)   . Pneumothorax   . Hypotension   . Pneumonia     Assessment: 75yo male presents w/ tachycardia and hypotension, tx'd for sepsis, now in Afib, to begin heparin.  Goal of Therapy:  Heparin level 0.3-0.7 units/ml Monitor platelets by anticoagulation protocol: Yes   Plan:  Will give heparin 3000 units IV bolus x1 followed by gtt at 1100 units/hr and monitor heparin levels and CBC.  Vernard GamblesVeronda Decklyn Hyder, PharmD, BCPS  04/30/2016,1:33 AM

## 2016-04-30 NOTE — Progress Notes (Signed)
[facility]. All questions answered, will transfer to 2C05.

## 2016-04-30 NOTE — ED Notes (Signed)
Patient cleaned up of soft stool  Full thickness bedsore to sacrum

## 2016-04-30 NOTE — Plan of Care (Signed)
Problem: Education: Goal: Knowledge of [facility] Education information/materials will improve Outcome: Progressing Discussed plan of care tonight and wife staying at home tonight.

## 2016-04-30 NOTE — Progress Notes (Signed)
Initial Nutrition Assessment  DOCUMENTATION CODES:   Not applicable  INTERVENTION:   Initiate TF via PEG tube with Vital AF 1.2 formula at goal rate of 60 ml/h (1440 ml per day) and Prostat 30 ml TID to provide 2028 kcals, 153 gm protein, 1168 ml free water daily  NUTRITION DIAGNOSIS:   Inadequate oral intake related to inability to eat as evidenced by NPO status  GOAL:   Patient will meet greater than or equal to 90% of their needs  MONITOR:   TF tolerance, Vent status, Labs, Weight trends, I & O's  REASON FOR ASSESSMENT:   Consult Enteral/tube feeding initiation and management  ASSESSMENT:   75 y.o. Male with PMH as outlined below including C6 / C7 injury in March 2017 with resulting paraplegic status and tracheostomy / PEG ([facility] ED [facility]29 from [facility] due to hypotension and tachycardia. SBP was in the 70's but typically runs in low 100's and pt is chronically on midodrine.  Patient is currently on ventilator support >> trach MV: 9.5 L/min Temp (24hrs), Avg:99.6 F (37.6 C), Min:98.2 F (36.8 C), Max:100 F (37.8 C)   CCM note reviewed >> pt is septic due to HCAP. Has chronic respiratory failure s/p trach March 2017. PEG tube in place >> TF regimen at [facility] unknown. RD consulted for TF initiation & management.  Diet Order:  Diet NPO time specified  Skin:      Unstagable Pressure injury left ischium  Stage 4 Pressure Injury right ischium  Stage 2 Pressure Injury left lateral heel Unstageable Pressure Injury left planter  Last BM:  6/30  Height:   Ht Readings from Last 1 Encounters:  04/30/16 6\' 1"  (1.854 m)    Weight:   Wt Readings from Last 1 Encounters:  04/30/16 196 lb 13.9 oz (89.3 kg)    Ideal Body Weight:  84 kg  BMI:  Body mass index is 25.98 kg/(m^2).  Estimated Nutritional Needs:   Kcal:  2018  Protein:  150-160 gm  Fluid:  per MD  EDUCATION NEEDS:   No education needs identified at  this time  Maureen ChattersKatie Isador Castille, RD, LDN Pager #: [phone] After-Hours Pager #: [phone]

## 2016-04-30 NOTE — Progress Notes (Signed)
Pt transferred to 2C05 without difficulty; SCDs left on pt. Receiving RN aware.

## 2016-04-30 NOTE — Progress Notes (Signed)
PULMONARY / CRITICAL CARE MEDICINE   Name: RAYMIR FROMMELT MRN: [id number] DOB: 04/08/1941    ADMISSION DATE:  04/27/2016 CONSULTATION DATE:  04/30/16  REFERRING MD:  Rhunette Croft (EDP)  CHIEF COMPLAINT:  Hypotension  HISTORY OF PRESENT ILLNESS:  Scott Lawrence is a 75 y.o. 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  He presented to [facility] ED 6/29 from [facility] due to hypotension and tachycardia.  SBP was in the 70's but typically runs in low 100's and pt is chronically on midodrine.  In ED, he remained mildly hypotensive (compared to his norm) with SBP in 90's.  He was started on low dose levophed and SBP improved to around 110.   CXR revealed right mid to lower consolidation and CTA showed mod right sided pleural effusion with underlying opacity.  PCCM was called for admission of HCAP.  PAST MEDICAL HISTORY :  He  has a past medical history of Coronary artery disease; STEMI (ST elevation myocardial infarction) (HCC); Pneumothorax; Hypotension; and Pneumonia.  PAST SURGICAL HISTORY: He  has past surgical history that includes Cervical spine surgery.  No Known Allergies  No current facility-administered medications on file prior to encounter.   No current outpatient prescriptions on file prior to encounter.    FAMILY HISTORY:  His has no family status information on file.   SOCIAL HISTORY: He  reports that he has never smoked. He does not have any smokeless tobacco history on file. He reports that he does not drink alcohol or use illicit drugs.  REVIEW OF SYSTEMS:  Positives in BOLD Gen: Denies fever, chills, weight change, fatigue, sweats HEENT: Denies blurred vision, double vision, hearing loss, tinnitus, sinus congestion, rhinorrhea, sore throat, neck stiffness, dysphagia PULM: Denies shortness of breath, cough, sputum production, hemoptysis, wheezing CV: Denies chest pain,  edema, orthopnea, paroxysmal nocturnal dyspnea, palpitations GI: Denies abdominal pain, nausea, vomiting, diarrhea, hematochezia, melena, constipation, change in bowel habits GU: Denies dysuria, hematuria, polyuria, oliguria, urethral discharge Endocrine: Denies hot or cold intolerance, polyuria, polyphagia or appetite change Derm: Denies rash, dry skin, scaling or peeling skin change Heme: Denies easy bruising, bleeding, bleeding gums Neuro: Denies headache, numbness, weakness, slurred speech, loss of memory or consciousness  SUBJECTIVE:  Pt nods appropriately to questions and whispers at times.  No acute distress.  He is currently mechanically ventilated via tracheostomy with FiO2 at 40%.  Per RN pt not requiring pressors at this time  VITAL SIGNS: BP 108/59 mmHg  Pulse 89  Temp(Src) 99.5 F (37.5 C) (Core (Comment))  Resp 18  Wt 196 lb 13.9 oz (89.3 kg)  SpO2 100%  HEMODYNAMICS:    VENTILATOR SETTINGS: Vent Mode:  [-] PRVC FiO2 (%):  [30 %-40 %] 40 % Set Rate:  [18 bmp] 18 bmp Vt Set:  [500 mL] 500 mL PEEP:  [5 cmH20] 5 cmH20 Plateau Pressure:  [17 cmH20-27 cmH20] 23 cmH20  INTAKE / OUTPUT: I/O last 3 completed shifts: In: 1859.1 [I.V.:659.1; IV Piggyback:1200] Out: 585 [Urine:585]   PHYSICAL EXAMINATION: General: Chronically ill appearing adult male, in NAD. Neuro: A&O x 3, non-focal.  Nods head and whispers in response to questions.  Paraplegic status. HEENT: [place]/AT. PERRL, sclerae anicteric.  Trach C/D/I. Cardiovascular: IRIR, no M/R/G.  Lungs: Respirations even and non labored.  Coarse bilaterally. Abdomen: BS x 4, soft, NT/ND.  PEG C/D/I. Musculoskeletal: No gross deformities, no edema.  Skin: stage IV sacral  spine PU  LABS:  BMET  Recent Labs Lab 04/14/2016 1825 04/30/16 0241  NA 134* 134*  K 3.9 3.4*  CL 94* 100*  CO2 33* 29  BUN 25* 19  CREATININE 0.46* 0.36*  GLUCOSE 194* 119*    Electrolytes  Recent Labs Lab 04/20/2016 1825 04/30/16 0241   CALCIUM 7.9* 7.5*  MG  --  1.8  PHOS  --  3.2    CBC  Recent Labs Lab 04/03/2016 1825 04/30/16 0241  WBC 15.7* 15.2*  HGB 7.9* 8.2*  HCT 26.5* 27.3*  PLT 418* 429*    Coag's No results for input(s): APTT, INR in the last 168 hours.  Sepsis Markers  Recent Labs Lab 04/02/2016 1840 04/12/2016 2252 04/30/16 0241  LATICACIDVEN 1.61 1.60  --   PROCALCITON  --   --  0.18    ABG  Recent Labs Lab 04/30/16 0234 04/30/16 0516  PHART 7.472* 7.406  PCO2ART 44.0 50.1*  PO2ART 64.0* 99.0    Liver Enzymes  Recent Labs Lab 04/28/2016 1825  AST 37  ALT 43  ALKPHOS 111  BILITOT 0.4  ALBUMIN 1.7*    Cardiac Enzymes No results for input(s): TROPONINI, PROBNP in the last 168 hours.  Glucose  Recent Labs Lab 04/30/16 0359 04/30/16 0856  GLUCAP 123* 116*    Imaging Dg Sacrum/coccyx  04/30/2016  CLINICAL DATA:  Decubitus ulcer. EXAM: SACRUM AND COCCYX - 2+ VIEW COMPARISON:  AP portable pelvis, 04/02/2016. FINDINGS: No fracture is evident. No bone lesion or bony destruction. No soft tissue foreign body. IMPRESSION: Negative. Electronically Signed   By: Ellery Plunkaniel R Mitchell M.D.   On: 04/30/2016 03:45   Ct Angio Chest Pe W/cm &/or Wo Cm  04/09/2016  CLINICAL DATA:  Fever. EXAM: CT ANGIOGRAPHY CHEST WITH CONTRAST TECHNIQUE: Multidetector CT imaging of the chest was performed using the standard protocol during bolus administration of intravenous contrast. Multiplanar CT image reconstructions and MIPs were obtained to evaluate the vascular anatomy. CONTRAST:  100 mL of Isovue 370 COMPARISON:  None. FINDINGS: Tracheostomy tube in place. The trachea and mainstem bronchi are normal. There is partial opacification of the right bronchus intermedius and right lower lobe bronchi. The remainder of the bronchi are normal. No pneumothorax. There is a right-sided pleural effusion with significant underlying opacity. More mild opacity is seen in the left base which could represent atelectasis or  another smaller infiltrate. No suspicious nodules or other infiltrates. No overt edema. There is adenopathy in the mediastinum with a representative node anterior to the carina on image 66 measuring 14.5 mm. No left-sided pleural effusion. No pericardial effusion. Coronary artery calcifications are noted. The heart is mildly enlarged. The thoracic aorta is non aneurysmal with no dissection. Mild atherosclerotic changes are seen. Evaluation of right lower lobe pulmonary arteries is limited due to the significant right lower lobe pulmonary opacity. However, within this limitation, no pulmonary emboli are identified. No abnormalities identified on limited views the upper abdomen. A PEG tube is only partially visualized and not well assessed. Visualized bones demonstrate pedicle rods and screws in the cervical spine. Mild anterior wedging of an upper thoracic vertebral body is age-indeterminate but favored to be chronic. A healed old sternal fracture is identified. Review of the MIP images confirms the above findings. IMPRESSION: 1. No pulmonary emboli. 2. Moderate size right-sided pleural effusion with significant underlying opacity. Recommend follow-up to resolution. More mild opacity in the left base as described above. 3. Atherosclerotic change in the thoracic aorta. 4. Adenopathy in the  mediastinum is nonspecific but may be reactive. Recommend attention on follow-up. Electronically Signed   By: Gerome Sam III M.D   On: 04/11/2016 23:54   Dg Pelvis Portable  04/10/2016  CLINICAL DATA:  75 year old male with prednisone interval. Evaluate for osteomyelitis. EXAM: PORTABLE PELVIS 1-2 VIEWS COMPARISON:  None. FINDINGS: A linear lucency through the left inferior pubic ramus may be artifactual or represent a nondisplaced fracture. Clinical correlations CT may provide better evaluation if there is high clinical concern for fracture. The bones are osteopenic. 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 There  is no dislocation. There is no bony erosion or periosteal reaction to suggest osteomyelitis. Evaluation for osteomyelitis is however very limited on this single view radiograph. MRI or a white blood cell nuclear scan may provide better evaluation if there is high clinical concern for osteomyelitis. A catheter is noted over the bladder. Round density within the pelvis may send a fecal ball within the rectum or the large prostate gland. IMPRESSION: Artifact versus a nondisplaced fracture of the left inferior pelvic ramus. Clinical correlation is recommended. No other acute findings identified. Electronically Signed   By: Elgie Collard M.D.   On: 04/28/2016 21:02   Dg Chest Port 1 View  04/01/2016  CLINICAL DATA:  Tachycardia and hypotension for 1 day EXAM: PORTABLE CHEST 1 VIEW COMPARISON:  None. FINDINGS: There is airspace consolidation throughout the right mid and lower lung zones consistent with pneumonia. There is a small right effusion as well. Left lung is clear. Heart is upper normal in size with pulmonary vascularity within normal limits. No adenopathy. There are foci of atherosclerotic calcification in the aorta. Tracheostomy tip is 6.8 cm above the carina. There is postoperative change in the lower cervical and upper thoracic regions. IMPRESSION: Tracheostomy as described. No pneumothorax. Extensive airspace consolidation in the right mid and lower lung zones with small right effusion. Left lung is clear. Aortic atherosclerosis. Electronically Signed   By: Bretta Bang III M.D.   On: 04/28/2016 19:23     STUDIES:  CXR 6/29 > right mid to lower consolidation  CTA chest 6/29 > negative for PE, mod right sided pleural effusion with underlying opacity.  CULTURES: Blood 6/29 >> Urine 6/29 >> Sputum 6/29 >>  ANTIBIOTICS: Vanc 6/29 >> Zosyn 6/29 >>  SIGNIFICANT EVENTS: 6/29 > admitted with HCAP.  LINES/TUBES: Trach (March 2017) >> PEG (March 2017) >>  DISCUSSION: 75 y.o. M who is  paraplegic following C6 / C7 injury in March 2017 with resultant trach / PEG and currently resides at [facility].  Brought to [facility] ED 6/29 with hypotension (though chronically hypotensive) and tachycardia.  Found to have HCAP; therefore, admitted for further evaluation and management.  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  SBP usually runs in low 100's. Hx A.fib (no anticoagulation listed on outpatient list), CAD, STEMI. P:  Continue maintenance IV fluids Goal SBP 100. Continue outpatient midodrine Continue heparin gtt (CHA2DS2VASC score 2).  INFECTIOUS A:   Sepsis - due to HCAP.  Lactate reassuring. Stage IV Sacral spine PU P:   Abx as above (Vanc / Zosyn).  Follow cultures as above. PCT algorithm to limit abx exposure. Wound consult pending for sacral pressure ulcer  PULMONARY A: Chronic respiratory failure - s/p trach March 2017. HCAP. P:   Full vent support. Assess ABG. VAP prevention measures. Abx / cultures per ID section. Levalbuterol PRN. CXR in AM.  RENAL A:  Pseudohypocalcemia - corrects to 9.74. P:   Continue maintenance iv fluids Trend BMP Replace electrolytes as indicated  GASTROINTESTINAL A:   Protein calorie malnutrition. GI prophylaxis. Nutrition. P:   Nutrition consult for TF's. SUP: Famotidine. NPO.  HEMATOLOGIC A: Anemia.   VTE Prophylaxis. P:  Transfuse for Hgb < 7. SCD's / heparin gtt. CBC in AM.  ENDOCRINE A:   DM.   P:   SSI.  NEUROLOGIC A:   Paraplegic status following C6 / C7 injury. P:    Hold outpatient clonazepam, duragesic, gabapentin, oxy.  Family updated: Family updated on 6/30  Interdisciplinary Family Meeting v Palliative Care Meeting:  Due by: 07/06.  Sonda Rumbleana Braeden Dolinski, AGNP  Pulmonary/Critical Care

## 2016-04-30 NOTE — ED Provider Notes (Addendum)
I saw and evaluated the patient, reviewed the resident's note and I agree with the findings and plan.   EKG Interpretation   Date/Time:  Thursday April 29 2016 18:07:42 EDT Ventricular Rate:  163 PR Interval:    QRS Duration: 83 QT Interval:  250 QTC Calculation: 412 R Axis:   118 Text Interpretation:  Supraventricular tachycardia Right axis deviation  [id number] pattern No acute changes Nonspecific ST and T wave abnormality  Confirmed by Rhunette CroftNANAVATI, MD, Kazimir Hartnett [phone]([id number]) on 04/18/2016 11:03:49 PM      EKG Interpretation  Date/Time:  Friday April 30 2016 01:46:02 EDT Ventricular Rate:  99 PR Interval:    QRS Duration: 83 QT Interval:  331 QTC Calculation: 425 R Axis:   85 Text Interpretation:  Sinus rhythm Borderline right axis deviation converted to sinus rhythm Confirmed by Kacy Conely, MD, Crissy Mccreadie ([id number]) on 04/30/2016 2:05:38 AM         Sepsis - Repeat Assessment  Performed at:    11:30 pm, 04/08/2016  Vitals     Blood pressure 108/67, pulse 161, resp. rate 20, weight 180 lb (81.647 kg), SpO2 94 %.  Heart:     Tachycardic  Lungs:    CTA  Capillary Refill:   <2 sec  Peripheral Pulse:   Radial pulse palpable  Skin:     Normal Color   Pt comes in with cc of tachycardia and hypotension from the LTAC facility ([facility]). Pt has hx of CAD, hypotension on midodrine. He is also IDDM pt. PT arrived with fever (101) and tachycardia (150s).  Pt denies nausea, emesis, fevers, chills, chest pains, shortness of breath, headaches, abdominal pain, uti like symptoms.  He has pressure ulcers - foul smelling - and a potential source of infection. Will get cxr, UA, sed rate, xray of the back. HR is in the 150s. ? Aflutter. If aflutter - is the cause sepsis, thyroid or PE? WE will get CT PE to r/o PE. Antibiotics and fluids started.    LATE ENTRY  1:00 am: PT didn't respond to fluids. HR is still elevated. I am thinking that flutter is highly likely, and so we gave patient adenosine - and  flutter was uncovered on the rhythm strip.  Spoke with [facility] - and the HR in the 160s started within the last 12 hours. Electric cardioversion not a wise choice given he would likely go back into afib due to the underlying infection. Lactate is normal pt is alert, so seems like he is perfusing tissue all right - but we spoke with Cardiology team, Dr. Sullivan LoneGilbert if it was worthwhile considering alternative agents for rata control to get a good atrial kick from the patient. Dr. Sullivan LoneGilbert recommends amiodarone with continuing norepi - this according to patient would increase the chances of cardioversion if it's needed in future and to continue with sepsis management for now.  CCM consulted for admission. CHADVASC SCORE IS 2 (AGE AND DIABETES) - heparin drip started.   CRITICAL CARE Performed by: Derwood KaplanNanavati, Draycen Leichter   Total critical care time: 75 minutes  Critical care time was exclusive of separately billable procedures and treating other patients.  Critical care was necessary to treat or prevent imminent or life-threatening deterioration.  Critical care was time spent personally by me on the following activities: development of treatment plan with patient and/or surrogate as well as nursing, discussions with consultants, evaluation of patient's response to treatment, examination of patient, obtaining history from patient or surrogate, ordering and performing treatments and interventions, ordering and review  of laboratory studies, ordering and review of radiographic studies, pulse oximetry and re-evaluation of patient's condition.    Derwood KaplanAnkit Tarig Zimmers, MD 04/30/16 [id number]  Derwood KaplanAnkit Mavrik Bynum, MD 04/30/16 [id number]

## 2016-04-30 NOTE — Progress Notes (Signed)
RT NOTE:  Pt transported to XRAY then to 2S05 without complication. Receiving RT aware.

## 2016-04-30 NOTE — Progress Notes (Signed)
ANTICOAGULATION CONSULT NOTE - Follow Up Consult  Pharmacy Consult for Heparin Indication: atrial fibrillation  No Known Allergies  Patient Measurements: Weight: 196 lb 13.9 oz (89.3 kg) Heparin Dosing Weight:  89.3 kg  Vital Signs: Temp: 99.7 F (37.6 C) (06/30 1000) Temp Source: Core (Comment) (06/30 0800) BP: 103/52 mmHg (06/30 1000) Pulse Rate: 89 (06/30 1000)  Labs:  Recent Labs  04/04/2016 1825 04/30/16 0241 04/30/16 0754  HGB 7.9* 8.2*  --   HCT 26.5* 27.3*  --   PLT 418* 429*  --   HEPARINUNFRC  --   --  <0.10*  CREATININE 0.46* 0.36*  --     CrCl cannot be calculated (Unknown ideal weight.).   Assessment: 75 y.o. male admitted on 04/19/2016 with sepsis from [facility].   AC: Afib. Initial HL <0.1. Hgb 7.9>8.2 this AM. Plts ok. CHA2DS2VASC score 2   Goal of Therapy:  Heparin level 0.3-0.7 units/ml Monitor platelets by anticoagulation protocol: Yes   Plan:  Heparin 3000 unit IV (re)-bolus Increase IV heparin 1400 units/hr Recheck HL in 6-8 hrs.    Kable Haywood S. Merilynn Finlandobertson, PharmD, BCPS Clinical Staff Pharmacist Pager [phone]  Misty Stanleyobertson, Janazia Schreier Stillinger 04/30/2016,10:35 AM

## 2016-04-30 NOTE — Progress Notes (Signed)
Pt had run of afib in 120s, converted back to NSR in 90s. Will continue to monitor closely.

## 2016-04-30 NOTE — H&P (Signed)
PULMONARY / CRITICAL CARE MEDICINE   Name: Scott Lawrence MRN: [id number] DOB: 05/31/1941    ADMISSION DATE:  04/11/2016 CONSULTATION DATE:  04/30/16  REFERRING MD:  Rhunette Croft (EDP)  CHIEF COMPLAINT:  Hypotension  HISTORY OF PRESENT ILLNESS:  Scott Lawrence is a 75 y.o. 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  He presented to [facility] ED 6/29 from [facility] due to hypotension and tachycardia.  SBP was in the 70's but typically runs in low 100's and pt is chronically on midodrine.  In ED, he remained mildly hypotensive (compared to his norm) with SBP in 90's.  He was started on low dose levophed and SBP improved to around 110.   CXR revealed right mid to lower consolidation and CTA showed mod right sided pleural effusion with underlying opacity.  PCCM was called for admission of HCAP.  PAST MEDICAL HISTORY :  He  has a past medical history of Coronary artery disease; STEMI (ST elevation myocardial infarction) (HCC); Pneumothorax; Hypotension; and Pneumonia.  PAST SURGICAL HISTORY: He  has past surgical history that includes Cervical spine surgery.  No Known Allergies  No current facility-administered medications on file prior to encounter.   No current outpatient prescriptions on file prior to encounter.    FAMILY HISTORY:  His has no family status information on file.   SOCIAL HISTORY: He  reports that he does not drink alcohol or use illicit drugs.  REVIEW OF SYSTEMS:   All negative; except for those that are bolded, which indicate positives.  Constitutional: weight loss, weight gain, night sweats, fevers, chills, fatigue, weakness.  HEENT: headaches, sore throat, sneezing, nasal congestion, post nasal drip, difficulty swallowing, tooth/dental problems, visual complaints, visual changes, ear aches. Neuro: difficulty with speech, weakness, numbness, ataxia. CV:  chest pain,  orthopnea, PND, swelling in lower extremities, dizziness, palpitations, syncope.  Resp: cough, hemoptysis, dyspnea, wheezing. GI  heartburn, indigestion, abdominal pain, nausea, vomiting, diarrhea, constipation, change in bowel habits, loss of appetite, hematemesis, melena, hematochezia.  GU: dysuria, change in color of urine, urgency or frequency, flank pain, hematuria. MSK: joint pain or swelling, decreased range of motion. Psych: change in mood or affect, depression, anxiety, suicidal ideations, homicidal ideations. Skin: rash, itching, bruising.   SUBJECTIVE:  Pt denies fevers/chills/sweats, headaches, chest pain, SOB, cough, N/V/D, abd pain.  Nods heads and whispers in response to questions.  VITAL SIGNS: BP 108/67 mmHg  Pulse 161  Resp 20  Wt 180 lb (81.647 kg)  SpO2 94%  HEMODYNAMICS:    VENTILATOR SETTINGS: Vent Mode:  [-] PRVC FiO2 (%):  [30 %] 30 % Set Rate:  [18 bmp] 18 bmp Vt Set:  [500 mL] 500 mL PEEP:  [5 cmH20] 5 cmH20 Plateau Pressure:  [17 cmH20] 17 cmH20  INTAKE / OUTPUT: I/O last 3 completed shifts: In: -  Out: 25 [Urine:25]   PHYSICAL EXAMINATION: General: Chronically ill appearing adult male, in NAD. Neuro: A&O x 3, non-focal.  Nods head and whispers in response to questions.  Paraplegic status. HEENT: [place]/AT. PERRL, sclerae anicteric.  Trach C/D/I. Cardiovascular: IRIR, no M/R/G.  Lungs: Respirations even and unlabored.  Coarse bilaterally. Abdomen: BS x 4, soft, NT/ND.  PEG C/D/I. Musculoskeletal: No gross deformities, no edema.  Skin: Intact, warm, no rashes.  LABS:  BMET  Recent Labs Lab 04/16/2016 1825  NA 134*  K 3.9  CL 94*  CO2 33*  BUN 25*  CREATININE 0.46*  GLUCOSE 194*    Electrolytes  Recent Labs Lab 04/12/2016 1825  CALCIUM 7.9*    CBC  Recent Labs Lab 04/09/2016 1825  WBC 15.7*  HGB 7.9*  HCT 26.5*  PLT 418*    Coag's No results for input(s): APTT, INR in the last 168 hours.  Sepsis Markers  Recent  Labs Lab 04/30/2016 1840 04/12/2016 2252  LATICACIDVEN 1.61 1.60    ABG No results for input(s): PHART, PCO2ART, PO2ART in the last 168 hours.  Liver Enzymes  Recent Labs Lab 04/23/2016 1825  AST 37  ALT 43  ALKPHOS 111  BILITOT 0.4  ALBUMIN 1.7*    Cardiac Enzymes No results for input(s): TROPONINI, PROBNP in the last 168 hours.  Glucose No results for input(s): GLUCAP in the last 168 hours.  Imaging Ct Angio Chest Pe W/cm &/or Wo Cm  04/22/2016  CLINICAL DATA:  Fever. EXAM: CT ANGIOGRAPHY CHEST WITH CONTRAST TECHNIQUE: Multidetector CT imaging of the chest was performed using the standard protocol during bolus administration of intravenous contrast. Multiplanar CT image reconstructions and MIPs were obtained to evaluate the vascular anatomy. CONTRAST:  100 mL of Isovue 370 COMPARISON:  None. FINDINGS: Tracheostomy tube in place. The trachea and mainstem bronchi are normal. There is partial opacification of the right bronchus intermedius and right lower lobe bronchi. The remainder of the bronchi are normal. No pneumothorax. There is a right-sided pleural effusion with significant underlying opacity. More mild opacity is seen in the left base which could represent atelectasis or another smaller infiltrate. No suspicious nodules or other infiltrates. No overt edema. There is adenopathy in the mediastinum with a representative node anterior to the carina on image 66 measuring 14.5 mm. No left-sided pleural effusion. No pericardial effusion. Coronary artery calcifications are noted. The heart is mildly enlarged. The thoracic aorta is non aneurysmal with no dissection. Mild atherosclerotic changes are seen. Evaluation of right lower lobe pulmonary arteries is limited due to the significant right lower lobe pulmonary opacity. However, within this limitation, no pulmonary emboli are identified. No abnormalities identified on limited views the upper abdomen. A PEG tube is only partially visualized  and not well assessed. Visualized bones demonstrate pedicle rods and screws in the cervical spine. Mild anterior wedging of an upper thoracic vertebral body is age-indeterminate but favored to be chronic. A healed old sternal fracture is identified. Review of the MIP images confirms the above findings. IMPRESSION: 1. No pulmonary emboli. 2. Moderate size right-sided pleural effusion with significant underlying opacity. Recommend follow-up to resolution. More mild opacity in the left base as described above. 3. Atherosclerotic change in the thoracic aorta. 4. Adenopathy in the mediastinum is nonspecific but may be reactive. Recommend attention on follow-up. Electronically Signed   By: Gerome Sam III M.D   On: 04/16/2016 23:54   Dg Pelvis Portable  04/14/2016  CLINICAL DATA:  75 year old male with prednisone interval. Evaluate for osteomyelitis. EXAM: PORTABLE PELVIS 1-2 VIEWS COMPARISON:  None. FINDINGS: A linear lucency through the left inferior pubic ramus may be artifactual or represent a nondisplaced fracture. Clinical correlations CT may provide better evaluation if there is high clinical concern for fracture. The bones are osteopenic. 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 There is no dislocation. There is no bony erosion or periosteal reaction to suggest osteomyelitis. Evaluation for osteomyelitis is however very limited on this single view radiograph. MRI or a white blood cell nuclear scan may provide better evaluation if there is high clinical concern for  osteomyelitis. A catheter is noted over the bladder. Round density within the pelvis may send a fecal ball within the rectum or the large prostate gland. IMPRESSION: Artifact versus a nondisplaced fracture of the left inferior pelvic ramus. Clinical correlation is recommended. No other acute findings identified. Electronically Signed   By: Elgie CollardArash  Radparvar M.D.   On: 11/14/15 21:02   Dg Chest Port 1 View  May 11, 2016  CLINICAL DATA:   Tachycardia and hypotension for 1 day EXAM: PORTABLE CHEST 1 VIEW COMPARISON:  None. FINDINGS: There is airspace consolidation throughout the right mid and lower lung zones consistent with pneumonia. There is a small right effusion as well. Left lung is clear. Heart is upper normal in size with pulmonary vascularity within normal limits. No adenopathy. There are foci of atherosclerotic calcification in the aorta. Tracheostomy tip is 6.8 cm above the carina. There is postoperative change in the lower cervical and upper thoracic regions. IMPRESSION: Tracheostomy as described. No pneumothorax. Extensive airspace consolidation in the right mid and lower lung zones with small right effusion. Left lung is clear. Aortic atherosclerosis. Electronically Signed   By: Bretta BangWilliam  Woodruff III M.D.   On: 11/14/15 19:23     STUDIES:  CXR 6/29 > right mid to lower consolidation  CTA chest 6/29 > mod right sided pleural effusion with underlying opacity.  CULTURES: Blood 6/29 > Urine 6/29 > Sputum 6/29 >  ANTIBIOTICS: Vanc 6/29 > Zosyn 6/29 >  SIGNIFICANT EVENTS: 6/29 > admitted with HCAP.  LINES/TUBES: Trach (March 2017) > PEG (March 2017) >  DISCUSSION: 75 y.o. M who is paraplegic following C6 / C7 injury in March 2017 with resultant trach / PEG and currently resides at [facility].  [facility] with hypotension (though chronically hypotensive) and tachycardia.  Found to have HCAP; therefore, admitted for further evaluation and management.  ASSESSMENT / PLAN:  CARDIOVASCULAR A:  Sepsis - due to HCAP.  Lactate reassuring. 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  SBP usually runs in low 100's. Hx A.fib (no anticoagulation listed on outpatient list), CAD, STEMI. P:  Additional 1L NS now. Goal SBP 100. Continue outpatient midodrine. Assess cortisol - may require stress steroids. Continue heparin gtt (CHA2DS2VASC score 2).  INFECTIOUS A:   Sepsis - due to HCAP.  Lactate reassuring. P:    Abx as above (Vanc / Zosyn).  Follow cultures as above. PCT algorithm to limit abx exposure.  PULMONARY A: Chronic respiratory failure - s/p trach March 2017. HCAP. P:   Full vent support. Assess ABG. VAP prevention measures. Abx / cultures per ID section. Levalbuterol PRN. CXR in AM.  RENAL A:   Pseudohypocalcemia - corrects to 9.74. P:   Assess ionized calcium. NS @ 100. BMP in AM.  GASTROINTESTINAL A:   Protein calorie malnutrition. GI prophylaxis. Nutrition. P:   Nutrition consult for TF's. SUP: Famotidine. NPO.  HEMATOLOGIC A: Anemia.   VTE Prophylaxis. P:  Transfuse for Hgb < 7. SCD's / heparin gtt. CBC in AM.  ENDOCRINE A:   DM.   P:   SSI.  NEUROLOGIC A:   Paraplegic status following C6 / C7 injury. P:    Hold outpatient clonazepam, duragesic, gabapentin, oxy.  Family updated: No family available.  Interdisciplinary Family Meeting v Palliative Care Meeting:  Due by: 07/06.  CC time: 35 minutes.  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  On exam, patient is able to whisper and nod to question, bilateral R>L crackles.  BP is 100/70 upon evaluation.  I reviewed CXR myself, right sided infiltrate noted and pleural effusion.  Will admit patient to the ICU, maintain full vent support as in [facility], begin antibiotics as above.  F/U on cultures.  Lactate is clearing.  Continue IVF resuscitation.  Hold off pressors for now.  Continue midodrine and f/u on PCT.  The patient is critically ill with multiple organ systems failure and requires high complexity decision making for assessment and support, frequent evaluation and titration of therapies,  application of advanced monitoring technologies and extensive interpretation of multiple databases.   Critical Care Time devoted to patient care services described in this note is  35  Minutes. This time reflects time of care of this signee Dr Koren BoundWesam Talicia Sui. This critical care time does not reflect procedure time, or teaching time or supervisory time of PA/NP/Med student/Med Resident etc but could involve care discussion time.  Alyson ReedyWesam G. Loreli Debruler, M.D. [facility]. Pager: [phone]. After hours pager: [phone].

## 2016-04-30 NOTE — Care Management Note (Signed)
Case Management Note  Patient Details  Name: Melanie CrazierScott H Westergaard MRN: [id number] Date of Birth: 1941-03-08  Subjective/Objective:     [facility] and spouse anticipates he will discharge back to that facility.  Per [facility] Ad[facility] Coordinator, he can transfer to an LTAC bed if MD deems appropriate.                       Expected Discharge Plan:  Skilled Nursing Facility  In-House Referral:  Clinical Social Work  Discharge planning Services  CM Consult  Status of Service:  In process, will continue to follow  Magdalene RiverMayo, Tammie Ellsworth T, RN 04/30/2016, 3:28 PM

## 2016-04-30 NOTE — Plan of Care (Signed)
Constantly oozes stool and dressings will not stay on, stool getting into sacral wounds. Dressings changed again.

## 2016-04-30 NOTE — Consult Note (Signed)
WOC wound consult note Reason for Consult:  Pressure injuries Patient from LTAC with pressure injuries POA.  Patient trach, awake and alert.  Wife at bedside reports patient has had limited time up in chair since pressure injuries developed.   Wound type:  Unstagable Pressure injury left ischium;3cm x 5cm x 3cm  Stage 4 Pressure Injury right ischium; 10cm x 6cm x 3.5cm  Stage 2 Pressure Injury left lateral heel: 3cm x 2cm x 0cm  Unstageable Pressure Injury left planter: 3cm x 5cm x 0cm Pressure Ulcer POA: Yes Measurement: see above Wound bed: Left ischium: 15% yellow; 85% pink/moist Right ischium: 50% black soft slough/50% pink Left lateral heel: intact serum filled blister Left plantar surface: 100% black, hard, intact skin Drainage (amount, consistency, odor) bilateral ischial wounds: moderate, odor most likely associated with necrotic tissue, not overt infection. Left foot wounds: intact, intact no drainage Periwound: intact some darkening of the tissue around the right ischial wound, purple wound may extend due to deep tissue injury Dressing procedure/placement/frequency: Add enzymatic debridement ointment with daily dressing changes. Patient on SPORT mattress while in ICU will need low air loss mattress for SDU, I have contacted them to make them aware since it does appear patient may be transferring out. Add Prevalon boots bilaterally, explained to patient and his wife the use of the boots.  They have had foot drop boots in the past and have had a concern recently that these were recently causing wounds on the left foot.  I have explained that the Prevalon boots are soft and serve a different purpose.  She is in agreement to use and request one for the right foot which has had a pressure injury in the past.  Foam dressing to the left heel PRN.  Will add PT for hydrotherapy to start tomorrow. Patient reports turning side to side with his head down makes him very nauseated. WOC requested  orders for nausea medication to be administered with hydrotherapy treatment.  Notified PT department of new orders. Spoke with Dr. Colletta Marylandamaswami to obtain orders for zofran with hydrotherapy tx, orders to be entered by bedside nurse Leonette Mostharles. I have requested dietician to evaluated current diet to maximize wound healing.  Bedside nurse reports incontinence of stool that is contaminating wounds, patient may benefit from diverting ostomy at some point for wound healing.  Would not consider NPWT VAC therapy until stool is not an issue due to proximity of anus to wounds.   WOC team will follow along with you for weekly wound assessments.  Please notify me of any acute changes in the wounds or any new areas of concerns Armen PickupMelody Gordie Belvin RN,CWOCN [phone]

## 2016-04-30 NOTE — Progress Notes (Addendum)
ANTICOAGULATION CONSULT NOTE - Follow Up Consult  Pharmacy Consult for Heparin Indication: atrial fibrillation  No Known Allergies  Patient Measurements: Height: 6\' 1"  (185.4 cm) Weight: 196 lb 13.9 oz (89.3 kg) IBW/kg (Calculated) : 79.9 Heparin Dosing Weight:  89.3 kg  Vital Signs: Temp: 98.6 F (37 C) (06/30 1700) Temp Source: Core (Comment) (06/30 1200) BP: 111/62 mmHg (06/30 1800) Pulse Rate: 87 (06/30 1800)  Labs:  Recent Labs  04/11/2016 1825 04/30/16 0241 04/30/16 0754 04/30/16 1812  HGB 7.9* 8.2*  --   --   HCT 26.5* 27.3*  --   --   PLT 418* 429*  --   --   HEPARINUNFRC  --   --  <0.10* <0.10*  CREATININE 0.46* 0.36*  --   --     Estimated Creatinine Clearance: 92.9 mL/min (by C-G formula based on Cr of 0.36).   Assessment:  75 y.o. male admitted on 04/07/2016 with sepsis from [facility].   He continues on IV heparin for Afib. HL is subtherapeutic again at <0.1 on 1400 units/hr. No bleeding noted. Spoke with RN who reports no line issues or bleeding.  Goal of Therapy:  Heparin level 0.3-0.7 units/ml Monitor platelets by anticoagulation protocol: Yes   Plan:  Heparin 3000 unit IV bolus then increase drip to 1700 units/hr  6 hr HL Daily HL and CBC Monitor for s/sx of bleeding   Reading HospitalJennifer Finneyt[place], WoodvillePharm.D., BCPS Clinical Pharmacist Pager: [phone] 04/30/2016 7:33 PM

## 2016-05-01 ENCOUNTER — Inpatient Hospital Stay (HOSPITAL_COMMUNITY): Payer: Medicare Other

## 2016-05-01 DIAGNOSIS — I482 Chronic atrial fibrillation, unspecified: Secondary | ICD-10-CM | POA: Diagnosis present

## 2016-05-01 DIAGNOSIS — E46 Unspecified protein-calorie malnutrition: Secondary | ICD-10-CM | POA: Diagnosis present

## 2016-05-01 DIAGNOSIS — J948 Other specified pleural conditions: Secondary | ICD-10-CM

## 2016-05-01 DIAGNOSIS — S22019A Unspecified fracture of first thoracic vertebra, initial encounter for closed fracture: Secondary | ICD-10-CM | POA: Diagnosis present

## 2016-05-01 DIAGNOSIS — S129XXA Fracture of neck, unspecified, initial encounter: Secondary | ICD-10-CM | POA: Diagnosis present

## 2016-05-01 DIAGNOSIS — A419 Sepsis, unspecified organism: Secondary | ICD-10-CM

## 2016-05-01 DIAGNOSIS — J90 Pleural effusion, not elsewhere classified: Secondary | ICD-10-CM | POA: Diagnosis present

## 2016-05-01 LAB — GLUCOSE, CAPILLARY
GLUCOSE-CAPILLARY: 132 mg/dL — AB (ref 65–99)
GLUCOSE-CAPILLARY: 138 mg/dL — AB (ref 65–99)
GLUCOSE-CAPILLARY: 129 mg/dL — AB (ref 65–99)
Glucose-Capillary: 113 mg/dL — ABNORMAL HIGH (ref 65–99)
Glucose-Capillary: 136 mg/dL — ABNORMAL HIGH (ref 65–99)
Glucose-Capillary: 139 mg/dL — ABNORMAL HIGH (ref 65–99)

## 2016-05-01 LAB — CORTISOL-AM, BLOOD: CORTISOL - AM: 10.4 ug/dL (ref 6.7–22.6)

## 2016-05-01 LAB — BASIC METABOLIC PANEL
ANION GAP: 5 (ref 5–15)
BUN: 15 mg/dL (ref 6–20)
CO2: 28 mmol/L (ref 22–32)
Calcium: 7.9 mg/dL — ABNORMAL LOW (ref 8.9–10.3)
Chloride: 104 mmol/L (ref 101–111)
Creatinine, Ser: <0.3 mg/dL — ABNORMAL LOW (ref 0.61–1.24)
GLUCOSE: 110 mg/dL — AB (ref 65–99)
POTASSIUM: 3.3 mmol/L — AB (ref 3.5–5.1)
Sodium: 137 mmol/L (ref 135–145)

## 2016-05-01 LAB — CBC
HCT: 24.4 % — ABNORMAL LOW (ref 39.0–52.0)
HEMOGLOBIN: 7.2 g/dL — AB (ref 13.0–17.0)
MCH: 25.4 pg — AB (ref 26.0–34.0)
MCHC: 29.5 g/dL — ABNORMAL LOW (ref 30.0–36.0)
MCV: 85.9 fL (ref 78.0–100.0)
Platelets: 338 10*3/uL (ref 150–400)
RBC: 2.84 MIL/uL — AB (ref 4.22–5.81)
RDW: 15.7 % — ABNORMAL HIGH (ref 11.5–15.5)
WBC: 10.2 10*3/uL (ref 4.0–10.5)

## 2016-05-01 LAB — HEPARIN LEVEL (UNFRACTIONATED)
HEPARIN UNFRACTIONATED: 0.21 [IU]/mL — AB (ref 0.30–0.70)
Heparin Unfractionated: 0.2 IU/mL — ABNORMAL LOW (ref 0.30–0.70)
Heparin Unfractionated: 0.25 IU/mL — ABNORMAL LOW (ref 0.30–0.70)

## 2016-05-01 LAB — URINE CULTURE: Culture: NO GROWTH

## 2016-05-01 LAB — VANCOMYCIN, TROUGH: Vancomycin Tr: 10 ug/mL — ABNORMAL LOW (ref 15–20)

## 2016-05-01 LAB — CALCIUM, IONIZED: CALCIUM, IONIZED, SERUM: 4.6 mg/dL (ref 4.5–5.6)

## 2016-05-01 LAB — PROCALCITONIN: PROCALCITONIN: 0.12 ng/mL

## 2016-05-01 MED ORDER — IPRATROPIUM-ALBUTEROL 0.5-2.5 (3) MG/3ML IN SOLN
3.0000 mL | Freq: Four times a day (QID) | RESPIRATORY_TRACT | Status: DC
  Administered 2016-05-01 – 2016-05-04 (×11): 3 mL via RESPIRATORY_TRACT
  Filled 2016-05-01 (×11): qty 3

## 2016-05-01 MED ORDER — SILVER NITRATE-POT NITRATE 75-25 % EX MISC
1.0000 | CUTANEOUS | Status: DC | PRN
  Filled 2016-05-01 (×2): qty 1

## 2016-05-01 MED ORDER — DM-GUAIFENESIN ER 30-600 MG PO TB12
1.0000 | ORAL_TABLET | Freq: Two times a day (BID) | ORAL | Status: DC
  Administered 2016-05-01 – 2016-05-02 (×3): 1 via ORAL
  Filled 2016-05-01 (×3): qty 1

## 2016-05-01 MED ORDER — VANCOMYCIN HCL 10 G IV SOLR
1250.0000 mg | Freq: Two times a day (BID) | INTRAVENOUS | Status: DC
  Administered 2016-05-02 – 2016-05-03 (×3): 1250 mg via INTRAVENOUS
  Filled 2016-05-01 (×5): qty 1250

## 2016-05-01 MED ORDER — LORAZEPAM 2 MG/ML IJ SOLN
0.5000 mg | Freq: Two times a day (BID) | INTRAMUSCULAR | Status: DC | PRN
  Administered 2016-05-03: 0.5 mg via INTRAVENOUS
  Filled 2016-05-01: qty 1

## 2016-05-01 NOTE — Progress Notes (Signed)
Pharmacy Antibiotic Note  Scott CrazierScott H Lawrence is a 75 y.o. male admitted on 04/06/2016 with sepsis.  Pharmacy has been consulted for vancomycin and zosyn dosing.  Vancomycin trough drawn today was low at 3910mcg/mL. Patient is a paraplegic, so will increase cautiously.   Plan: -Increase Vancomycin to 1250mg  IV q12h.  Goal trough 15-20 mcg/mL. -Zosyn 3.375g IV q8h (4 hour infusion).  -Monitor culture data, renal function and clinical course  Height: 6\' 1"  (185.4 cm) Weight: 200 lb 2.8 oz (90.8 kg) IBW/kg (Calculated) : 79.9  Temp (24hrs), Avg:98.6 F (37 C), Min:98.4 F (36.9 C), Max:98.9 F (37.2 C)   Recent Labs Lab 04/09/2016 1825 04/26/2016 1840 04/12/2016 2252 04/30/16 0241 05/01/16 0239 05/01/16 1607  WBC 15.7*  --   --  15.2* 10.2  --   CREATININE 0.46*  --   --  0.36* <0.30*  --   LATICACIDVEN  --  1.61 1.60  --   --   --   VANCOTROUGH  --   --   --   --   --  10*    CrCl cannot be calculated (Patient has no serum creatinine result on file.).    No Known Allergies  Antimicrobials this admission: Vanc 6/29 >>  Zosyn 6/29 >>   Dose adjustments this admission: 7/1: VT = 5810mcg> incr to 1250mg  IV q12h  Microbiology results: 6/30 MRSA PCR >> negative  6/29 BCx >> ngtd 6/29 UCx >> NG 7/1 RCx (TA) >> gram stain with GNR + GPC in pairs  [name], PharmD, BCPS Clinical Pharmacist Pager: [phone] 05/01/2016 5:38 PM
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
## 2016-05-01 NOTE — Progress Notes (Signed)
ANTICOAGULATION CONSULT NOTE - Follow Up Consult  Pharmacy Consult for Heparin Indication: atrial fibrillation  No Known Allergies  Patient Measurements: Height: 6\' 1"  (185.4 cm) Weight: 200 lb 2.8 oz (90.8 kg) IBW/kg (Calculated) : 79.9 Heparin Dosing Weight:  89.3 kg  Vital Signs: Temp: 98.6 F (37 C) (07/01 2113) Temp Source: Oral (07/01 2113) BP: 120/72 mmHg (07/01 2113) Pulse Rate: 80 (07/01 2138)  Labs:  Recent Labs  2016-09-29 1825 04/30/16 0241  05/01/16 0239 05/01/16 1021 05/01/16 2105  HGB 7.9* 8.2*  --  7.2*  --   --   HCT 26.5* 27.3*  --  24.4*  --   --   PLT 418* 429*  --  338  --   --   HEPARINUNFRC  --   --   < > 0.21* 0.20* 0.25*  CREATININE 0.46* 0.36*  --  <0.30*  --   --   < > = values in this interval not displayed.  CrCl cannot be calculated (Patient has no serum creatinine result on file.).   Assessment:  75 y.o. male admitted on 10/15/2016 with sepsis from [facility]. Pt on IV heparin for Afib.  Heparin level is still low this evening at 0.26 units/mL. CBC this AM - Hgb 7.2 (low but relatively stable).   No bleeding noted.  Goal of Therapy:  Heparin level 0.3-0.7 units/ml Monitor platelets by anticoagulation protocol: Yes   Plan:  -Increase Heparin to 2250 units/hr -Daily HL and CBC -Heparin to stop 7/2 at 0800 per orders entered by Dr. Joseph ArtWoods- follow up resumption. Notes are not clear for reason to stopping therapy.  Ayaansh Smail D. Naamah Boggess, PharmD, BCPS Clinical Pharmacist Pager: [phone] 05/01/2016 10:25 PM

## 2016-05-01 NOTE — Progress Notes (Signed)
Physical Therapy Wound Evaluation/Treatment Patient Details  Name: Scott Lawrence MRN: [id number] Date of Birth: [birth year]  Today's Date: 05/01/2016 Time: 1100-1200 Time Calculation (min): 60 min  Subjective  Subjective: Pt reports being hot and then cold throughout session. Noted sweating multiple times.  Patient and Family Stated Goals: Heal wound.  Date of Onset:  (PTA) Prior Treatments: Pt's wife reports I&D at [facility] recently.   Pain Score: Pt reports no pain throughout session.  Wound Assessment  Wound / Incision (Open or Dehisced) 05/01/16 Other (Comment) Ischial tuberosity Right (Active)  Dressing Type ABD;Barrier Film (skin prep);Gauze (Comment);Moist to dry 05/01/2016 11:05 AM  Dressing Changed Changed 05/01/2016 11:05 AM  Dressing Status Clean;Dry;Intact 05/01/2016 11:05 AM  Dressing Change Frequency Daily 05/01/2016 11:05 AM  Site / Wound Assessment Pink;Yellow;Black 05/01/2016 11:05 AM  % Wound base Red or Granulating 50% 05/01/2016 11:05 AM  % Wound base Yellow 45% 05/01/2016 11:05 AM  % Wound base Black 5% 05/01/2016 11:05 AM  % Wound base Other (Comment) 0% 05/01/2016 11:05 AM  Peri-wound Assessment Intact 05/01/2016 11:05 AM  Wound Length (cm) 7 cm 05/01/2016 11:05 AM  Wound Width (cm) 8.2 cm 05/01/2016 11:05 AM  Wound Depth (cm) 4.2 cm 05/01/2016 11:05 AM  Undermining (cm) 2.5 cm at 2:00 05/01/2016 11:05 AM  Margins Unattached edges (unapproximated) 05/01/2016 11:05 AM  Closure None 05/01/2016 11:05 AM  Drainage Amount Minimal 05/01/2016 11:05 AM  Drainage Description Purulent 05/01/2016 11:05 AM  Treatment Debridement (Selective);Hydrotherapy (Pulse lavage);Packing (Saline gauze) 05/01/2016 11:05 AM   Santyl applied to wound bed prior to applying dressing.    Wound / Incision (Open or Dehisced) 05/01/16 Other (Comment) Ischial tuberosity Left (Active)  Dressing Type ABD;Barrier Film (skin prep);Gauze (Comment);Moist to dry 05/01/2016 11:05 AM  Dressing Changed Changed 05/01/2016 11:05 AM  Dressing  Status Clean;Dry;Intact 05/01/2016 11:05 AM  Dressing Change Frequency Daily 05/01/2016 11:05 AM  Site / Wound Assessment Pink;Yellow;Black 05/01/2016 11:05 AM  % Wound base Red or Granulating 20% 05/01/2016 11:05 AM  % Wound base Yellow 30% 05/01/2016 11:05 AM  % Wound base Black 50% 05/01/2016 11:05 AM  % Wound base Other (Comment) 0% 05/01/2016 11:05 AM  Peri-wound Assessment Intact;Induration 05/01/2016 11:05 AM  Wound Length (cm) 4.8 cm 05/01/2016 11:05 AM  Wound Width (cm) 7.5 cm 05/01/2016 11:05 AM  Wound Depth (cm) 2 cm 05/01/2016 11:05 AM  Undermining (cm) 1.0 at 11:00 05/01/2016 11:05 AM  Margins Unattached edges (unapproximated) 05/01/2016 11:05 AM  Closure None 05/01/2016 11:05 AM  Drainage Amount Minimal 05/01/2016 11:05 AM  Drainage Description Purulent 05/01/2016 11:05 AM  Treatment Debridement (Selective);Hydrotherapy (Pulse lavage);Packing (Saline gauze) 05/01/2016 11:05 AM  Santyl applied to wound bed prior to applying dressing.  Hydrotherapy Pulsed lavage therapy - wound location: Bilateral ischiums Pulsed Lavage with Suction (psi): 12 psi Pulsed Lavage with Suction - Normal Saline Used: 1000 mL Pulsed Lavage Tip: Tip with splash shield Selective Debridement Selective Debridement - Location: Bilateral ischiums Selective Debridement - Tools Used: Forceps;Scalpel Selective Debridement - Tissue Removed: Yellow and black necrotic tissue   Wound Assessment and Plan  Wound Therapy - Assess/Plan/Recommendations Wound Therapy - Clinical Statement: Pt presents with pressure injuries to bilateral ischiums. Pt will benefit from continued hydrotherapy for selective removal of necrotic tissue and promote wound bed healing.  Wound Therapy - Functional Problem List: Decreased tolerance for OOB/sitting in wheelchair.  Factors Delaying/Impairing Wound Healing: Altered sensation;Incontinence;Immobility;Multiple medical problems Hydrotherapy Plan: Debridement;Dressing change;Patient/family education;Pulsatile lavage  with suction Wound Therapy - Frequency: 6X /  week Wound Therapy - Follow Up Recommendations: Skilled nursing facility Wound Plan: See above  Wound Therapy Goals- Improve the function of patient's integumentary system by progressing the wound(s) through the phases of wound healing (inflammation - proliferation - remodeling) by: Decrease Necrotic Tissue to: 25% Decrease Necrotic Tissue - Progress: Goal set today Increase Granulation Tissue to: 75% Increase Granulation Tissue - Progress: Goal set today Goals/treatment plan/discharge plan were made with and agreed upon by patient/family: Yes Time For Goal Achievement: 7 days Wound Therapy - Potential for Goals: Good  Goals will be updated until maximal potential achieved or discharge criteria met.  Discharge criteria: when goals achieved, discharge from hospital, MD decision/surgical intervention, no progress towards goals, refusal/missing three consecutive treatments without notification or medical reason.  GP     Rolinda Roan 05/01/2016, 1:52 PM   Rolinda Roan, PT, DPT Acute Rehabilitation Services Pager: [phone]

## 2016-05-01 NOTE — Progress Notes (Signed)
Pt transferred to room 2C09 larger room same unit to acomodate the new airbed and vent more comfortably. All belongings to new room across the hall.

## 2016-05-01 NOTE — Progress Notes (Signed)
ANTICOAGULATION CONSULT NOTE - Follow Up Consult  Pharmacy Consult for Heparin Indication: atrial fibrillation  No Known Allergies  Patient Measurements: Height: 6\' 1"  (185.4 cm) Weight: 196 lb 13.9 oz (89.3 kg) IBW/kg (Calculated) : 79.9 Heparin Dosing Weight:  89.3 kg  Vital Signs: Temp: 98.4 F (36.9 C) (06/30 2308) Temp Source: Oral (06/30 2308) BP: 113/64 mmHg (07/01 0300) Pulse Rate: 95 (07/01 0317)  Labs:  Recent Labs  04/30/2016 1825 04/30/16 0241 04/30/16 0754 04/30/16 1812 05/01/16 0239  HGB 7.9* 8.2*  --   --  7.2*  HCT 26.5* 27.3*  --   --  24.4*  PLT 418* 429*  --   --  338  HEPARINUNFRC  --   --  <0.10* <0.10* 0.21*  CREATININE 0.46* 0.36*  --   --  <0.30*    CrCl cannot be calculated (Patient has no serum creatinine result on file.).   Assessment:  75 y.o. male admitted on 04/10/2016 with sepsis from [facility]. Pt on IV heparin for Afib. Heparin level remains subtherapeutic on 1700 units/hr. Hgb 7.2 (low but relatively stable). No issues with line or bleeding reported per RN.  Goal of Therapy:  Heparin level 0.3-0.7 units/ml Monitor platelets by anticoagulation protocol: Yes   Plan:  Increase heparin gtt to 1850 units/hr  6 hr HL F/u CBC closely  Christoper Fabianaron Miana Politte, PharmD, BCPS Clinical pharmacist, pager [phone] 05/01/2016 3:27 AM

## 2016-05-01 NOTE — Progress Notes (Signed)
ANTICOAGULATION CONSULT NOTE - Follow Up Consult  Pharmacy Consult for Heparin Indication: atrial fibrillation  No Known Allergies  Patient Measurements: Height: 6\' 1"  (185.4 cm) Weight: 200 lb 2.8 oz (90.8 kg) IBW/kg (Calculated) : 79.9 Heparin Dosing Weight:  89.3 kg  Vital Signs: Temp: 98.7 F (37.1 C) (07/01 0800) Temp Source: Oral (07/01 0800) BP: 116/62 mmHg (07/01 0800) Pulse Rate: 93 (07/01 0800)  Labs:  Recent Labs  01-Dec-2015 1825 04/30/16 0241  04/30/16 1812 05/01/16 0239 05/01/16 1021  HGB 7.9* 8.2*  --   --  7.2*  --   HCT 26.5* 27.3*  --   --  24.4*  --   PLT 418* 429*  --   --  338  --   HEPARINUNFRC  --   --   < > <0.10* 0.21* 0.20*  CREATININE 0.46* 0.36*  --   --  <0.30*  --   < > = values in this interval not displayed.  CrCl cannot be calculated (Patient has no serum creatinine result on file.).   Assessment:  75 y.o. male admitted on 07-05-16 with sepsis from [facility]. Pt on IV heparin for Afib.  Heparin level this morning remains SUBtherapeutic despite a rate increase this morning (HL 0.2 << 0.21, goal of 0.3-0.7). Discussed with RN - infusing at the appropriate rate without interruptions or issues. No bleeding noted. Will increase and recheck a 8 hr HL. CBC this AM - Hgb 7.2 (low but relatively stable).    Goal of Therapy:  Heparin level 0.3-0.7 units/ml Monitor platelets by anticoagulation protocol: Yes   Plan:  1. Increase Heparin to 2150 units/hr (21.5 ml/hr) 2. Will continue to monitor for any signs/symptoms of bleeding and will follow up with heparin level in 8 hours   Georgina PillionElizabeth Klani Caridi, PharmD, BCPS Clinical Pharmacist Pager: [phone] 05/01/2016 12:21 PM

## 2016-05-01 DEATH — deceased

## 2016-05-02 ENCOUNTER — Inpatient Hospital Stay (HOSPITAL_COMMUNITY): Payer: Medicare Other

## 2016-05-02 DIAGNOSIS — J9621 Acute and chronic respiratory failure with hypoxia: Secondary | ICD-10-CM | POA: Diagnosis present

## 2016-05-02 DIAGNOSIS — Z9911 Dependence on respirator [ventilator] status: Secondary | ICD-10-CM

## 2016-05-02 DIAGNOSIS — E876 Hypokalemia: Secondary | ICD-10-CM

## 2016-05-02 DIAGNOSIS — A419 Sepsis, unspecified organism: Secondary | ICD-10-CM | POA: Diagnosis present

## 2016-05-02 DIAGNOSIS — G8220 Paraplegia, unspecified: Secondary | ICD-10-CM | POA: Diagnosis present

## 2016-05-02 DIAGNOSIS — J90 Pleural effusion, not elsewhere classified: Secondary | ICD-10-CM | POA: Diagnosis present

## 2016-05-02 DIAGNOSIS — I2510 Atherosclerotic heart disease of native coronary artery without angina pectoris: Secondary | ICD-10-CM

## 2016-05-02 DIAGNOSIS — J9611 Chronic respiratory failure with hypoxia: Secondary | ICD-10-CM

## 2016-05-02 DIAGNOSIS — I9589 Other hypotension: Secondary | ICD-10-CM | POA: Diagnosis present

## 2016-05-02 LAB — COMPREHENSIVE METABOLIC PANEL
ALBUMIN: 1.4 g/dL — AB (ref 3.5–5.0)
ALK PHOS: 85 U/L (ref 38–126)
ALT: 27 U/L (ref 17–63)
ANION GAP: 7 (ref 5–15)
AST: 19 U/L (ref 15–41)
BILIRUBIN TOTAL: 0.2 mg/dL — AB (ref 0.3–1.2)
BUN: 11 mg/dL (ref 6–20)
CALCIUM: 7.7 mg/dL — AB (ref 8.9–10.3)
CO2: 27 mmol/L (ref 22–32)
Chloride: 103 mmol/L (ref 101–111)
Creatinine, Ser: <0.3 mg/dL — ABNORMAL LOW (ref 0.61–1.24)
GLUCOSE: 128 mg/dL — AB (ref 65–99)
Potassium: 3 mmol/L — ABNORMAL LOW (ref 3.5–5.1)
Sodium: 137 mmol/L (ref 135–145)
TOTAL PROTEIN: 5.6 g/dL — AB (ref 6.5–8.1)

## 2016-05-02 LAB — GLUCOSE, CAPILLARY
GLUCOSE-CAPILLARY: 109 mg/dL — AB (ref 65–99)
GLUCOSE-CAPILLARY: 154 mg/dL — AB (ref 65–99)
GLUCOSE-CAPILLARY: 96 mg/dL (ref 65–99)
Glucose-Capillary: 146 mg/dL — ABNORMAL HIGH (ref 65–99)
Glucose-Capillary: 129 mg/dL — ABNORMAL HIGH (ref 65–99)
Glucose-Capillary: 105 mg/dL — ABNORMAL HIGH (ref 65–99)

## 2016-05-02 LAB — CBC
HCT: 24.1 % — ABNORMAL LOW (ref 39.0–52.0)
Hemoglobin: 7.3 g/dL — ABNORMAL LOW (ref 13.0–17.0)
MCH: 26.3 pg (ref 26.0–34.0)
MCHC: 30.3 g/dL (ref 30.0–36.0)
MCV: 86.7 fL (ref 78.0–100.0)
PLATELETS: 340 10*3/uL (ref 150–400)
RBC: 2.78 MIL/uL — AB (ref 4.22–5.81)
RDW: 15.7 % — ABNORMAL HIGH (ref 11.5–15.5)
WBC: 10.4 10*3/uL (ref 4.0–10.5)

## 2016-05-02 LAB — LIPID PANEL
CHOLESTEROL: 98 mg/dL (ref 0–200)
HDL: 25 mg/dL — ABNORMAL LOW (ref >40)
LDL CALC: 60 mg/dL (ref 0–99)
Total CHOL/HDL Ratio: 3.9 RATIO
Triglycerides: 64 mg/dL (ref <150)
VLDL: 13 mg/dL (ref 0–40)

## 2016-05-02 LAB — HEPARIN LEVEL (UNFRACTIONATED): Heparin Unfractionated: 0.23 IU/mL — ABNORMAL LOW (ref 0.30–0.70)

## 2016-05-02 LAB — LACTIC ACID, PLASMA: Lactic Acid, Venous: 1 mmol/L (ref 0.5–1.9)

## 2016-05-02 LAB — MAGNESIUM: Magnesium: 1.8 mg/dL (ref 1.7–2.4)

## 2016-05-02 LAB — PREPARE RBC (CROSSMATCH)

## 2016-05-02 MED ORDER — POTASSIUM CHLORIDE 20 MEQ/15ML (10%) PO SOLN
50.0000 meq | Freq: Once | ORAL | Status: AC
  Administered 2016-05-02: 50 meq
  Filled 2016-05-02: qty 45

## 2016-05-02 MED ORDER — SODIUM CHLORIDE 0.9 % IV SOLN
Freq: Once | INTRAVENOUS | Status: AC
  Administered 2016-05-02: 23:00:00 via INTRAVENOUS

## 2016-05-02 MED ORDER — HEPARIN (PORCINE) IN NACL 100-0.45 UNIT/ML-% IJ SOLN
2500.0000 [IU]/h | INTRAMUSCULAR | Status: DC
  Administered 2016-05-02: 2500 [IU]/h via INTRAVENOUS
  Filled 2016-05-02: qty 250
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
## 2016-05-02 NOTE — Plan of Care (Signed)
Problem: Education: Goal: Knowledge of [place] [facility] Education information/materials will improve Outcome: Progressing Discussed earlier in the shift the need and help of an air mattress bed to help with his wounds.

## 2016-05-02 NOTE — Progress Notes (Signed)
Per Dr. Joseph ArtWoods, [place] collar should be on even during thoracentesis.

## 2016-05-02 NOTE — Progress Notes (Signed)
ANTICOAGULATION CONSULT NOTE - Follow Up Consult  Pharmacy Consult for Heparin Indication: atrial fibrillation  No Known Allergies  Patient Measurements: Height: 6\' 1"  (185.4 cm) Weight: 213 lb (96.616 kg) IBW/kg (Calculated) : 79.9 Heparin Dosing Weight:  89.3 kg  Vital Signs: Temp: 97.8 F (36.6 C) (07/02 1300) Temp Source: Oral (07/02 1300) BP: 122/72 mmHg (07/02 1300) Pulse Rate: 97 (07/02 1300)  Labs:  Recent Labs  04/30/16 0241  05/01/16 0239 05/01/16 1021 05/01/16 2105 05/02/16 0341 05/02/16 0833  HGB 8.2*  --  7.2*  --   --   --  7.3*  HCT 27.3*  --  24.4*  --   --   --  24.1*  PLT 429*  --  338  --   --   --  340  HEPARINUNFRC  --   < > 0.21* 0.20* 0.25* 0.23*  --   CREATININE 0.36*  --  <0.30*  --   --  <0.30*  --   < > = values in this interval not displayed.  CrCl cannot be calculated (Patient has no serum creatinine result on file.).   Assessment:  75 y.o. male admitted on 04/25/2016 with sepsis from [facility]. Pt on IV heparin for Afib.  Heparin level this morning was SUBtherapeutic and the rate was increased (HL 0.23, goal of 0.3-0.7) however the drip was subsequently turned off at 0800 for plans for a thoracentesis. The thoracentesis has been postponed until 7/3 and plans are to resume heparin at this time. Will restart at the increased rate from this AM since no heparin levels have been checked at this rate yet.    Goal of Therapy:  Heparin level 0.3-0.7 units/ml Monitor platelets by anticoagulation protocol: Yes   Plan:  1. Restart heparin at 2500 units/hr (25 ml/hr) - will plan to turn off at 0800 on 7/3 2. Will continue to monitor for any signs/symptoms of bleeding and will follow up with heparin level in 8 hours   Georgina PillionElizabeth Kepler Mccabe, PharmD, BCPS Clinical Pharmacist Pager: [phone] 05/02/2016 3:24 PM

## 2016-05-02 NOTE — Progress Notes (Signed)
Attempted to call wife and got corrected cell phone number from son, unable to reach.  Obtained consent from son Scott Lawrence.

## 2016-05-02 NOTE — Plan of Care (Signed)
Problem: Education: Goal: Knowledge of [facility] Education information/materials will improve Outcome: Progressing Discussed feeding tube being stopped

## 2016-05-02 NOTE — Progress Notes (Signed)
ANTICOAGULATION CONSULT NOTE - Follow Up Consult  Pharmacy Consult for heparin Indication: atrial fibrillation  Labs:  Recent Labs  February 15, 2016 1825 04/30/16 0241  05/01/16 0239 05/01/16 1021 05/01/16 2105 05/02/16 0341  HGB 7.9* 8.2*  --  7.2*  --   --   --   HCT 26.5* 27.3*  --  24.4*  --   --   --   PLT 418* 429*  --  338  --   --   --   HEPARINUNFRC  --   --   < > 0.21* 0.20* 0.25* 0.23*  CREATININE 0.46* 0.36*  --  <0.30*  --   --   --   < > = values in this interval not displayed.   Assessment: 75yo male remains subtherapeutic on heparin with lower level despite rate increase.  Goal of Therapy:  Heparin level 0.3-0.7 units/ml   Plan:  Will increase heparin gtt by 10% to 2500 units/hr; plan to hold heparin this am at 0800 but still unclear, will f/u.  Vernard GamblesVeronda Brendia Dampier, PharmD, BCPS  05/02/2016,5:04 AM

## 2016-05-03 ENCOUNTER — Inpatient Hospital Stay (HOSPITAL_COMMUNITY): Payer: Medicare Other

## 2016-05-03 DIAGNOSIS — I340 Nonrheumatic mitral (valve) insufficiency: Secondary | ICD-10-CM

## 2016-05-03 DIAGNOSIS — M4852XA Collapsed vertebra, not elsewhere classified, cervical region, initial encounter for fracture: Secondary | ICD-10-CM

## 2016-05-03 DIAGNOSIS — G8220 Paraplegia, unspecified: Secondary | ICD-10-CM

## 2016-05-03 DIAGNOSIS — I9589 Other hypotension: Secondary | ICD-10-CM

## 2016-05-03 DIAGNOSIS — J9621 Acute and chronic respiratory failure with hypoxia: Secondary | ICD-10-CM

## 2016-05-03 LAB — BODY FLUID CELL COUNT WITH DIFFERENTIAL
EOS FL: 0 %
Lymphs, Fluid: 38 %
MONOCYTE-MACROPHAGE-SEROUS FLUID: 8 % — AB (ref 50–90)
Neutrophil Count, Fluid: 54 % — ABNORMAL HIGH (ref 0–25)
OTHER CELLS FL: 0 %
WBC FLUID: 1013 uL — AB (ref 0–1000)

## 2016-05-03 LAB — ECHOCARDIOGRAM COMPLETE
Ao-asc: 32 cm
E/e' ratio: 12.29
EWDT: 145 ms
FS: 29 % (ref 28–44)
Height: 73 in
IVS/LV PW RATIO, ED: 0.84
LA diam end sys: 24 mm
LA diam index: 1.08 cm/m2
LA vol: 52.8 mL
LASIZE: 24 mm
LAVOLA4C: 59.6 mL
LAVOLIN: 23.7 mL/m2
LDCA: 4.15 cm2
LV E/e' medial: 12.29
LV E/e'average: 12.29
LV TDI E'LATERAL: 13.1
LV TDI E'MEDIAL: 5.98
LV e' LATERAL: 13.1 cm/s
LVOTD: 23 mm
MV Dec: 145
MV pk E vel: 161 m/s
MVPG: 10 mmHg
PW: 9.75 mm — AB (ref 0.6–1.1)
RV TAPSE: 33.6 mm
Weight: 3463.87 oz

## 2016-05-03 LAB — COMPREHENSIVE METABOLIC PANEL
ALBUMIN: 1.6 g/dL — AB (ref 3.5–5.0)
ALK PHOS: 89 U/L (ref 38–126)
ALT: 28 U/L (ref 17–63)
ANION GAP: 6 (ref 5–15)
AST: 21 U/L (ref 15–41)
BILIRUBIN TOTAL: 0.8 mg/dL (ref 0.3–1.2)
BUN: 7 mg/dL (ref 6–20)
CALCIUM: 8 mg/dL — AB (ref 8.9–10.3)
CO2: 26 mmol/L (ref 22–32)
Chloride: 104 mmol/L (ref 101–111)
Creatinine, Ser: 0.33 mg/dL — ABNORMAL LOW (ref 0.61–1.24)
GFR calc non Af Amer: >60 mL/min (ref >60)
Glucose, Bld: 98 mg/dL (ref 65–99)
POTASSIUM: 3.4 mmol/L — AB (ref 3.5–5.1)
SODIUM: 136 mmol/L (ref 135–145)
TOTAL PROTEIN: 5.8 g/dL — AB (ref 6.5–8.1)

## 2016-05-03 LAB — CBC
HEMATOCRIT: 23.2 % — AB (ref 39.0–52.0)
HEMOGLOBIN: 7.1 g/dL — AB (ref 13.0–17.0)
MCH: 26.3 pg (ref 26.0–34.0)
MCHC: 30.6 g/dL (ref 30.0–36.0)
MCV: 85.9 fL (ref 78.0–100.0)
Platelets: 357 10*3/uL (ref 150–400)
RBC: 2.7 MIL/uL — ABNORMAL LOW (ref 4.22–5.81)
RDW: 16.3 % — AB (ref 11.5–15.5)
WBC: 14.2 10*3/uL — ABNORMAL HIGH (ref 4.0–10.5)

## 2016-05-03 LAB — GLUCOSE, SEROUS FLUID: Glucose, Fluid: 107 mg/dL

## 2016-05-03 LAB — GLUCOSE, CAPILLARY
GLUCOSE-CAPILLARY: 107 mg/dL — AB (ref 65–99)
GLUCOSE-CAPILLARY: 117 mg/dL — AB (ref 65–99)
GLUCOSE-CAPILLARY: 91 mg/dL (ref 65–99)
Glucose-Capillary: 103 mg/dL — ABNORMAL HIGH (ref 65–99)
Glucose-Capillary: 105 mg/dL — ABNORMAL HIGH (ref 65–99)
Glucose-Capillary: 90 mg/dL (ref 65–99)

## 2016-05-03 LAB — ABO/RH: ABO/RH(D): A POS

## 2016-05-03 LAB — HEMOGLOBIN A1C
HEMOGLOBIN A1C: 6 % — AB (ref 4.8–5.6)
MEAN PLASMA GLUCOSE: 126 mg/dL

## 2016-05-03 LAB — PREPARE RBC (CROSSMATCH)

## 2016-05-03 LAB — LACTATE DEHYDROGENASE, PLEURAL OR PERITONEAL FLUID: LD FL: 100 U/L — AB (ref 3–23)

## 2016-05-03 LAB — HEPARIN LEVEL (UNFRACTIONATED): HEPARIN UNFRACTIONATED: 0.21 [IU]/mL — AB (ref 0.30–0.70)

## 2016-05-03 LAB — MAGNESIUM: Magnesium: 1.8 mg/dL (ref 1.7–2.4)

## 2016-05-03 MED ORDER — ENOXAPARIN SODIUM 100 MG/ML ~~LOC~~ SOLN
100.0000 mg | Freq: Two times a day (BID) | SUBCUTANEOUS | Status: DC
  Administered 2016-05-03 – 2016-05-06 (×6): 100 mg via SUBCUTANEOUS
  Filled 2016-05-03 (×6): qty 1

## 2016-05-03 MED ORDER — SODIUM CHLORIDE 0.9 % IV SOLN
Freq: Once | INTRAVENOUS | Status: AC
  Administered 2016-05-03: 14:00:00 via INTRAVENOUS

## 2016-05-03 MED ORDER — ACETAMINOPHEN 160 MG/5ML PO SOLN
650.0000 mg | Freq: Four times a day (QID) | ORAL | Status: DC | PRN
  Administered 2016-05-03 – 2016-05-09 (×4): 650 mg
  Filled 2016-05-03 (×5): qty 20.3

## 2016-05-03 MED ORDER — LIDOCAINE HCL 1 % IJ SOLN
INTRAMUSCULAR | Status: AC
  Filled 2016-05-03: qty 20

## 2016-05-03 MED ORDER — GUAIFENESIN 100 MG/5ML PO SOLN
10.0000 mL | Freq: Four times a day (QID) | ORAL | Status: AC
  Administered 2016-05-03 – 2016-05-06 (×11): 200 mg via ORAL
  Filled 2016-05-03 (×13): qty 10

## 2016-05-03 NOTE — Progress Notes (Signed)
PULMONARY / CRITICAL CARE MEDICINE   Name: Scott CrazierScott H Lawrence MRN: [id number] DOB: January 11, 1941    ADMISSION DATE:  04/16/2016 CONSULTATION DATE:  04/30/16  REFERRING MD:  Rhunette CroftNanavati (EDP)  CHIEF COMPLAINT:  Hypotension  BRIEF 75 y/o male s/p C-spine injury 12/2015 sent to [facility] with vent, trach [facility] with septic shock from HCAP.    SUBJECTIVE:  Increased respiratory effort on vent WBC up   VITAL SIGNS: BP 134/76 mmHg  Pulse 99  Temp(Src) 100.1 F (37.8 C) (Oral)  Resp 22  Ht 6\' 1"  (1.854 m)  Wt 216 lb 7.9 oz (98.2 kg)  BMI 28.57 kg/m2  SpO2 98%  HEMODYNAMICS:    VENTILATOR SETTINGS: Vent Mode:  [-] CPAP;PSV FiO2 (%):  [40 %] 40 % Set Rate:  [18 bmp-23 bmp] 18 bmp Vt Set:  [500 mL] 500 mL PEEP:  [5 cmH20] 5 cmH20 Pressure Support:  [10 cmH20] 10 cmH20 Plateau Pressure:  [18 cmH20-24 cmH20] 18 cmH20  INTAKE / OUTPUT: I/O last 3 completed shifts: In: 6732.4 [I.V.:3670.4; Blood:335; NG/GT:1777; IV Piggyback:950] Out: 2251 [Urine:2250; Emesis/NG output:1]   PHYSICAL EXAMINATION: General: Chronically ill appearing adult male, tachypneic on vent HENT: trach in place, hard collar in place PULM: diminished RLL, increased respiratory effort CV: Tachy, regular Belly: soft, nontender, non-distended MSK: diminished bulk/tone Neuro: opens eyes to voice  LABS:  BMET  Recent Labs Lab 05/01/16 0239 05/02/16 0341 05/03/16 0654  NA 137 137 136  K 3.3* 3.0* 3.4*  CL 104 103 104  CO2 28 27 26   BUN 15 11 7   CREATININE <0.30* <0.30* 0.33*  GLUCOSE 110* 128* 98    Electrolytes  Recent Labs Lab 04/30/16 0241 05/01/16 0239 05/02/16 0341 05/03/16 0654  CALCIUM 7.5* 7.9* 7.7* 8.0*  MG 1.8  --  1.8 1.8  PHOS 3.2  --   --   --     CBC  Recent Labs Lab 05/01/16 0239 05/02/16 0833 05/03/16 0654  WBC 10.2 10.4 14.2*  HGB 7.2* 7.3* 7.1*  HCT 24.4* 24.1* 23.2*  PLT 338 340 357    Coag's No results for input(s): APTT, INR in the last 168  hours.  Sepsis Markers  Recent Labs Lab 04/23/2016 1840 04/20/2016 2252 04/30/16 0241 05/01/16 0239 05/02/16 0341  LATICACIDVEN 1.61 1.60  --   --  1.0  PROCALCITON  --   --  0.18 0.12  --     ABG  Recent Labs Lab 04/30/16 0234 04/30/16 0516  PHART 7.472* 7.406  PCO2ART 44.0 50.1*  PO2ART 64.0* 99.0    Liver Enzymes  Recent Labs Lab 04/08/2016 1825 05/02/16 0341 05/03/16 0654  AST 37 19 21  ALT 43 27 28  ALKPHOS 111 85 89  BILITOT 0.4 0.2* 0.8  ALBUMIN 1.7* 1.4* 1.6*    Cardiac Enzymes No results for input(s): TROPONINI, PROBNP in the last 168 hours.  Glucose  Recent Labs Lab 05/02/16 1314 05/02/16 1616 05/02/16 1953 05/02/16 2337 05/03/16 0400 05/03/16 0736  GLUCAP 129* 105* 96 109* 107* 103*    Imaging No results found.   STUDIES:  CXR 6/29 > right mid to lower consolidation  CTA chest 6/29 > negative for PE, mod right sided pleural effusion with underlying opacity.  CULTURES: Blood 6/29 >> Urine 6/29 >> Sputum 6/29 >> abundant GNR  ANTIBIOTICS: Vanc 6/29 >> Zosyn 6/29 >>  SIGNIFICANT EVENTS: 6/29 > admitted with HCAP.  LINES/TUBES: Trach (March 2017) >> PEG (March 2017) >>  DISCUSSION: 75 y.o. 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  He has ongoing pulmonary toilette issues as of 7/3.  ASSESSMENT / PLAN:   INFECTIOUS A:   HCAP Stage IV Sacral spine PU P:   Would stop vanc, [facility] Follow cultures as above Wound consult pending for sacral pressure ulcer  PULMONARY A: Chronic respiratory failure - s/p trach March 2017 HCAP Pulmonary toilette problems, tachypnea 7/3 P:   Full vent support> don't wean Continue HCAP treatment Start bag/lavage qshift Add liquid guaifenesin Repeat CXR 7/5 May need bronch if tachypnea continues   Heber CarolinaBrent Siona Coulston, MD  PCCM Pager: [phone] Cell: [phone] After 3pm or if no  response, call [phone]

## 2016-05-03 NOTE — Progress Notes (Signed)
SLP Cancellation Note  Patient Details Name: Scott CrazierScott H Lawrence MRN: [id number] DOB: [birth year]   Cancelled treatment:       Reason Eval/Treat Not Completed: Patient not medically ready.  The patient remains on the vent and per nursing failed a weaning trial.  ST will follow up in 2 days for readiness for clinical swallowing evaluation.   Dimas AguasMelissa Jessamine Barcia, MA, CCC-SLP Acute Rehab SLP [phone] [name], [name] 05/03/2016, 8:58 AM

## 2016-05-03 NOTE — Care Management Important Message (Signed)
Important Message  Patient Details  Name: Scott Lawrence MRN: [id number] Date of Birth: 23-Apr-1941   Medicare Important Message Given:  Yes    [name], Jeilyn Reznik Abena 05/03/2016, 10:24 AM

## 2016-05-03 NOTE — Procedures (Signed)
   US guided Rt thoracentesis PORTABLE  320 cc yellow fluid obtained Sent for labs per MD  Tolerated well  Port cxr pending

## 2016-05-03 NOTE — Progress Notes (Addendum)
ANTICOAGULATION CONSULT NOTE - Follow Up Consult  Pharmacy Consult for Heparin >> Lovenox Indication: atrial fibrillation  No Known Allergies  Patient Measurements: Height: 6\' 1"  (185.4 cm) Weight: 216 lb 7.9 oz (98.2 kg) IBW/kg (Calculated) : 79.9 Heparin Dosing Weight:  89.3 kg  Vital Signs: Temp: 100.1 F (37.8 C) (07/03 0721) Temp Source: Oral (07/03 0721) BP: 134/76 mmHg (07/03 0721) Pulse Rate: 99 (07/03 0721)  Labs:  Recent Labs  05/01/16 0239  05/01/16 2105 05/02/16 0341 05/02/16 0833 05/03/16 0654  HGB 7.2*  --   --   --  7.3* 7.1*  HCT 24.4*  --   --   --  24.1* 23.2*  PLT 338  --   --   --  340 357  HEPARINUNFRC 0.21*  < > 0.25* 0.23*  --  0.21*  CREATININE <0.30*  --   --  <0.30*  --  0.33*  < > = values in this interval not displayed.  Estimated Creatinine Clearance: 101.4 mL/min (by C-G formula based on Cr of 0.33).   Assessment:  75 y.o. male admitted on 04/25/2016 with sepsis from [facility]. Pt on anticoagulation for Afib.  Heparin level this morning remained SUBtherapeutic despite multiple rate increases (HL 0.21, goal of 0.3-0.7). The heparin drip was turned off at 0800 today for plans for a thoracentesis.The RN noted increased wound bleeding during a dressing change this AM. Pharmacy has now been consulted to transition the patient to full-dose lovenox.   The patient is scheduled this afternoon for a thoracentesis and plans are to start the full-dose lovenox ~3 hours after this procedure. SCr 0.33 (paraplegic), estimated CrCl>30 ml/min.   Goal of Therapy:  Anti-Xa level 0.6-1 units/ml 4hrs after LMWH dose given Monitor platelets by anticoagulation protocol: Yes   Plan:  1. Start Lovenox 100 mg SQ every 12 hours (3 hours after thoracentesis this afternoon) 2. Will continue to monitor for any signs/symptoms of bleeding and will monitor renal function for any necessary dose adjustments  Georgina PillionElizabeth Martin, PharmD, BCPS Clinical Pharmacist Pager:  [phone] 05/03/2016 11:06 AM    ADDENDUM: Thoracentesis completed at 1530. Will schedule lovenox 100mg  SQ q12h starting at 1830. Wound assessment from this evening shows no increased or overt bleeding.   Sherle Poeob Cavin Longman, PharmD Clinical Pharmacist  4:37 PM, 05/03/2016

## 2016-05-03 NOTE — Progress Notes (Addendum)
Physical Therapy Wound Treatment Patient Details  Name: MANJOT HINKS MRN: [id number] Date of Birth: 03/27/1941  Today's Date: 05/03/2016 Time: 7829-5621 Time Calculation (min): 50 min  Subjective  Subjective: Pt mouthing words appropriately Patient and Family Stated Goals: Heal wound.  Date of Onset:  (PTA) Prior Treatments: Pt's wife reports I&D at [facility] recently.   Pain Score:  No pain from wound. Pt given anti nausea medication prior to treatment  Wound Assessment  Wound / Incision (Open or Dehisced) 05/01/16 Other (Comment) Ischial tuberosity Right (Active)  Dressing Type ABD;Barrier Film (skin prep);Gauze (Comment);Moist to dry 05/03/2016  4:04 PM  Dressing Changed Changed 05/03/2016  4:04 PM  Dressing Status Clean;Dry;Intact 05/03/2016  4:04 PM  Dressing Change Frequency Daily 05/03/2016  4:04 PM  Site / Wound Assessment Pink;Yellow;Bleeding 05/03/2016  4:04 PM  % Wound base Red or Granulating 75% 05/03/2016  4:04 PM  % Wound base Yellow 25% 05/03/2016  4:04 PM  % Wound base Black 0% 05/03/2016  4:04 PM  % Wound base Other (Comment) 0% 05/03/2016  4:04 PM  Peri-wound Assessment Intact 05/03/2016  4:04 PM  Wound Length (cm) 7 cm 05/01/2016 11:05 AM  Wound Width (cm) 8.2 cm 05/01/2016 11:05 AM  Wound Depth (cm) 4.2 cm 05/01/2016 11:05 AM  Undermining (cm) 2.5 cm at 2:00 05/01/2016 11:05 AM  Margins Unattached edges (unapproximated) 05/03/2016  4:04 PM  Closure None 05/03/2016  4:04 PM  Drainage Amount Minimal 05/03/2016  4:04 PM  Drainage Description Purulent 05/03/2016  4:04 PM  Treatment Debridement (Selective);Hydrotherapy (Pulse lavage);Packing (Saline gauze) 05/03/2016  4:04 PM   Santyl applied to wound bed prior to applying dressing.   Wound / Incision (Open or Dehisced) 05/01/16 Other (Comment) Ischial tuberosity Left (Active)  Dressing Type ABD;Barrier Film (skin prep);Gauze (Comment);Moist to dry 05/03/2016  4:04 PM  Dressing Changed Changed 05/03/2016  4:04 PM  Dressing Status Clean;Dry;Intact 05/03/2016   4:04 PM  Dressing Change Frequency Daily 05/03/2016  4:04 PM  Site / Wound Assessment Pink;Yellow;Black 05/03/2016  4:04 PM  % Wound base Red or Granulating 20% 05/03/2016  4:04 PM  % Wound base Yellow 30% 05/03/2016  4:04 PM  % Wound base Black 50% 05/03/2016  4:04 PM  % Wound base Other (Comment) 0% 05/03/2016  4:04 PM  Peri-wound Assessment Intact;Induration 05/03/2016  4:04 PM  Wound Length (cm) 4.8 cm 05/01/2016 11:05 AM  Wound Width (cm) 7.5 cm 05/01/2016 11:05 AM  Wound Depth (cm) 2 cm 05/01/2016 11:05 AM  Undermining (cm) 1.0 at 11:00 05/01/2016 11:05 AM  Margins Unattached edges (unapproximated) 05/03/2016  4:04 PM  Closure None 05/03/2016  4:04 PM  Drainage Amount Minimal 05/03/2016  4:04 PM  Drainage Description Purulent 05/03/2016  4:04 PM  Treatment Debridement (Selective);Hydrotherapy (Pulse lavage);Packing (Saline gauze) 05/03/2016  4:04 PM  Santyl applied to wound bed prior to applying dressing.  Hydrotherapy Pulsed lavage therapy - wound location: Bilateral ischiums (8-12) Pulsed Lavage with Suction (psi): 12 psi Pulsed Lavage with Suction - Normal Saline Used: 1000 mL Pulsed Lavage Tip: Tip with splash shield Selective Debridement Selective Debridement - Location: Bilateral ischiums Selective Debridement - Tools Used: Scalpel;Scissors Selective Debridement - Tissue Removed: Yellow and black necrotic tissue   Wound Assessment and Plan  Wound Therapy - Assess/Plan/Recommendations Wound Therapy - Clinical Statement: Pt incontinent of stool with soiling of all wounds. Pt required deep suctioning by nurse during treatment and right after treatment. Rt wound cleaning up some. Wound Therapy - Functional Problem List: Decreased tolerance for  OOB/sitting in wheelchair.  Factors Delaying/Impairing Wound Healing: Altered sensation;Incontinence;Immobility;Multiple medical problems Hydrotherapy Plan: Debridement;Dressing change;Patient/family education;Pulsatile lavage with suction Wound Therapy -  Frequency: 6X / week Wound Therapy - Follow Up Recommendations: Skilled nursing facility Wound Plan: See above  Wound Therapy Goals- Improve the function of patient's integumentary system by progressing the wound(s) through the phases of wound healing (inflammation - proliferation - remodeling) by: Decrease Necrotic Tissue to: 25% Decrease Necrotic Tissue - Progress: Progressing toward goal Increase Granulation Tissue to: 75% Increase Granulation Tissue - Progress: Progressing toward goal  Goals will be updated until maximal potential achieved or discharge criteria met.  Discharge criteria: when goals achieved, discharge from hospital, MD decision/surgical intervention, no progress towards goals, refusal/missing three consecutive treatments without notification or medical reason.  GP     Crystall Donaldson 05/03/2016, 4:15 PM  [place]

## 2016-05-03 NOTE — Progress Notes (Signed)
PROGRESS NOTE  [employer] TEAM 1 - Stepdown/ICU TEAM  Scott Lawrence  LOV:[id number] DOB: [birth year] DOA: 04/09/2016 PCP: Verneita Griffes, MD   Brief Narrative:  75 y.o. male with Hx CAD, STEMI 12/29/02, Hypotension, HLD, S/P fracture wrist 06/05/14, Pneumothorax,C6 / C7 injury in March 2017 with resulting paraplegia > trach / PEG (performed at [facility]) who presented to [facility] ED 6/29 from [facility] due to hypotension and tachycardia. SBP was in the 70's but typically runs in low 100's and pt is chronically on midodrine.  In the ED he remained mildly hypotensive with SBP in 90s. He was started on low dose levophed and SBP improved to around 110.CXR revealed right mid to lower consolidation and CTA showed mod right sided pleural effusion with underlying opacity.  Assessment & Plan:   Sepsis due to HCAP -Continue empiric antibiotic therapy - DC vancomycin as MRSA screen negative and respiratory culture revealing gram-negative rods  Pleural effusion -IR for right thoracentesis today  Chronic respiratory failure - s/p trach March 2017 - ventilator dependent -Per patient/wife [facility] had[facility] attempting to [facility] patient; at times tolerating 3-4 days off vent, then usually relapsing with infection or atelectasis/inability clear secretions -Ventilator management per PCCM  ?Chronic Parox Afib  -CHA2DS2VASC score 2 - NSR at time of exam today, and no afib noted on EKGs since admit - unclear chronicity of this problem - I find not mention of this in a review of records from [place] -Not on anticoagulation per outpatient MAR - currently on heparin gtt - transition to full dose lovenox for now and follow tele - anticoag may not be indicated long term if we can not confirm this pt has chronic Afib   CAD/STEMI -Asymptomatic  Chronic Hypotension -SBP usually runs in low 100s -Stable at this time   Hypokalemia -Continue to replace and follow  Protein calorie malnutrition -Continue tube feeding  Anemia   -In setting of coronary artery disease and hypotension goal is hemoglobin greater than 7.9 - transfuse additional unit today and follow  Mild hyperglycemia  -A1c NOT c/w DM   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 -Aspen collar 3 months  -Dr. Sherral Hammers spoke with Dr. Kary Kos Neurosurgery and reviewed C-spine/T-spine x-ray films. Agrees that hardware looks good, HOWEVER since surgery was only ~3 months ago patient MUST remain in Aspen collar. Stated once we have patient's pneumonia under control obtain CT of the C-spine and he would be glad to evaluate if time for collar to come off.  DVT prophylaxis: Heparin drip > lovenox  Code Status: Full  Family Communication: No family present at time of exam Disposition Plan: SDU  Consultants:  PCCM  Dr. Kary Kos Neurosurgery - phone    Procedures/Significant Events:  3/6 Ct Head W/o Contrast (at [facility] ): - frontal abnormality right inferior frontal lobe artifact  vs contusion  3/6 Ct Spine Cervical [facility] ):-severe cervical spine injury at the C6/C7 disc space. -Grade 2/3 anterolisthesis in conjunction with bilateral dislocated, locked facets. -Associated fracture right inferior C6 facet,Severe cord compression as a result.  -Suspect subtle marginal avulsion fracture along the anterior superior corner of the C7 vertebral body in relation to anterior longitudinal ligament. 3/7Ct Cta Chest W/o [facility] ): -Sternal fracture with associated chest wall and mediastinal hematoma.-Posterior pneumomediastinum closely associated with esophageal wall. -Centrally located focus of gas which appears to be within the esophageal wall,concerning for esophageal injury. - -Trace associated right pneumothorax at the superomedial aspect.  3/7 fusion posterior thoracic; C4-T3 POSTERIOR CERVICAL THORACIC FUSION BY DR. [facility] ) 3/7 EGD (at [facility] performed by  Dalyai, Maia Petties, 3/13(at [facility] ) Tracheostomy  performed by Dr Theora Gianotti., 6/29 CXR > right mid to lower consolidation  6/29 CTA chest > mod right sided pleural effusion with underlying opacity  7/1 C-spine x-ray clearing;- Abnormal alignment is seen at C6-7.  7/1 T-spine x-ray:-Mild compression deformities of T7 and T8-8 as well as mild depression of the superior endplate of T4 unchanged from the recent CT scan and age indeterminate. - Posterior fusion hardware from approximately C4-T3 intact. -7/2 transfuse 1 unit PRBC  Antimicrobials: Vanc 6/29 > 7/3 Zosyn 6/29 >  Subjective: The patient is resting comfortably in bed on the ventilator.  There is no evidence of acute respiratory distress or uncontrolled pain.  Respirations are nonlabored.  Objective: Filed Vitals:   05/03/16 0405 05/03/16 0500 05/03/16 0600 05/03/16 0721  BP:  147/73 134/76 134/76  Pulse:  98 104 99  Temp: 99.6 F (37.6 C)   100.1 F (37.8 C)  TempSrc: Oral   Oral  Resp:  22 24 22   Height:      Weight: 98.2 kg (216 lb 7.9 oz)     SpO2:  99% 99% 98%    Intake/Output Summary (Last 24 hours) at 05/03/16 1006 Last data filed at 05/03/16 0757  Gross per 24 hour  Intake 3805.91 ml  Output   1450 ml  Net 2355.91 ml   Filed Weights   05/02/16 0353 05/03/16 0307 05/03/16 0405  Weight: 96.616 kg (213 lb) 98.431 kg (217 lb) 98.2 kg (216 lb 7.9 oz)   Examination: General: No acute respiratory distress evident Lungs: Poor air movement bilateral bases right >left with no wheeze Cardiovascular: Regular rate and rhythm without murmur gallop or rub Abdomen: Nontender, nondistended, soft, bowel sounds positive, no rebound, no ascites, no appreciable mass Extremities: No significant cyanosis, or clubbing - trace edema bilateral lower extremities  CBC:  Recent Labs Lab 04/10/2016 1825 04/30/16 0241 05/01/16 0239 05/02/16 0833 05/03/16 0654  WBC 15.7* 15.2* 10.2 10.4 14.2*    NEUTROABS 12.8*  --   --   --   --   HGB 7.9* 8.2* 7.2* 7.3* 7.1*  HCT 26.5* 27.3* 24.4* 24.1* 23.2*  MCV 86.3 87.5 85.9 86.7 85.9  PLT 418* 429* 338 340 287   Basic Metabolic Panel:  Recent Labs Lab 04/23/2016 1825 04/30/16 0241 05/01/16 0239 05/02/16 0341 05/03/16 0654  NA 134* 134* 137 137 136  K 3.9 3.4* 3.3* 3.0* 3.4*  CL 94* 100* 104 103 104  CO2 33* 29 28 27 26   GLUCOSE 194* 119* 110* 128* 98  BUN 25* 19 15 11 7   CREATININE 0.46* 0.36* <0.30* <0.30* 0.33*  CALCIUM 7.9* 7.5* 7.9* 7.7* 8.0*  MG  --  1.8  --  1.8 1.8  PHOS  --  3.2  --   --   --    GFR: Estimated Creatinine Clearance: 101.4 mL/min (by C-G formula based on Cr of 0.33).   Liver Function Tests:  Recent Labs Lab 04/15/2016 1825 05/02/16 0341 05/03/16 0654  AST 37 19 21  ALT 43 27 28  ALKPHOS 111 85 89  BILITOT 0.4 0.2* 0.8  PROT 6.5 5.6* 5.8*  ALBUMIN 1.7* 1.4* 1.6*   HbA1C:  Recent Labs  05/02/16 0341  HGBA1C 6.0*   CBG:  Recent Labs Lab 05/02/16 1616 05/02/16 1953  05/02/16 2337 05/03/16 0400 05/03/16 0736  GLUCAP 105* 96 109* 107* 103*   Lipid Profile:  Recent Labs  05/02/16 0341  CHOL 98  HDL 25*  LDLCALC 60  TRIG 64  CHOLHDL 3.9   Urine analysis:    Component Value Date/Time   COLORURINE AMBER* 04/30/2016 1840   APPEARANCEUR CLOUDY* 04/25/2016 1840   LABSPEC 1.029 04/24/2016 1840   PHURINE 7.0 04/21/2016 1840   GLUCOSEU NEGATIVE 04/25/2016 1840   HGBUR NEGATIVE 04/18/2016 [place] NEGATIVE 04/24/2016 [place] 04/23/2016 1840   PROTEINUR 30* 04/07/2016 1840   NITRITE NEGATIVE 04/12/2016 1840   LEUKOCYTESUR SMALL* 04/18/2016 1840    Recent Results (from the past 240 hour(s))  Blood Culture (routine x 2)     Status: None (Preliminary result)   Collection Time: 04/18/2016  6:20 PM  Result Value Ref Range Status   Specimen Description BLOOD LEFT HAND  Final   Special Requests BOTTLES DRAWN AEROBIC ONLY 5CC  Final   Culture NO GROWTH 3  DAYS  Final   Report Status PENDING  Incomplete  Blood Culture (routine x 2)     Status: None (Preliminary result)   Collection Time: 04/01/2016  6:20 PM  Result Value Ref Range Status   Specimen Description BLOOD LEFT FOREARM  Final   Special Requests IN PEDIATRIC BOTTLE 3CC  Final   Culture NO GROWTH 3 DAYS  Final   Report Status PENDING  Incomplete  Urine culture     Status: Abnormal   Collection Time: 04/26/2016  6:47 PM  Result Value Ref Range Status   Specimen Description URINE, CATHETERIZED  Final   Special Requests NONE  Final   Culture <10,000 COLONIES/mL INSIGNIFICANT GROWTH (A)  Final   Report Status 05/01/2016 FINAL  Final  MRSA PCR Screening     Status: None   Collection Time: 04/30/16  4:40 AM  Result Value Ref Range Status   MRSA by PCR NEGATIVE NEGATIVE Final    Comment:        The GeneXpert MRSA Assay (FDA approved for NASAL specimens only), is one component of a comprehensive MRSA colonization surveillance program. It is not intended to diagnose MRSA infection nor to guide or monitor treatment for MRSA infections.   Urine culture     Status: None   Collection Time: 04/30/16 11:27 AM  Result Value Ref Range Status   Specimen Description URINE, CATHETERIZED  Final   Special Requests NONE  Final   Culture NO GROWTH  Final   Report Status 05/01/2016 FINAL  Final  Culture, respiratory (NON-Expectorated)     Status: None (Preliminary result)   Collection Time: 05/01/16 10:17 AM  Result Value Ref Range Status   Specimen Description TRACHEAL ASPIRATE  Final   Special Requests NONE  Final   Gram Stain   Final    ABUNDANT WBC PRESENT, PREDOMINANTLY PMN NO SQUAMOUS EPITHELIAL CELLS SEEN FEW GRAM NEGATIVE RODS RARE GRAM NEGATIVE COCCI IN PAIRS    Culture ABUNDANT GRAM NEGATIVE RODS  Final   Report Status PENDING  Incomplete     Scheduled Meds: . acetaminophen  1,000 mg Oral Once  . antiseptic oral rinse  7 mL Mouth Rinse QID  . chlorhexidine gluconate (SAGE  KIT)  15 mL Mouth Rinse BID  . collagenase   Topical Daily  . famotidine  20 mg Per Tube BID  . guaiFENesin  10 mL Oral Q6H  . insulin aspart  0-15 Units Subcutaneous Q4H  .  ipratropium-albuterol  3 mL Nebulization Q6H  . midodrine  10 mg Per Tube TID WC  . piperacillin-tazobactam (ZOSYN)  IV  3.375 g Intravenous Q8H     LOS: 3 days   Time spent: 35 minutes  Cherene Altes, MD [facility] Office  [phone] Pager - Text Page per Shea Evans as per below:  On-Call/Text Page:      [email or web address]      password [email or web address]  If 7PM-7AM, please contact night-coverage [email or web address] Password [email or web address] 05/03/2016, 10:06 AM

## 2016-05-03 NOTE — Progress Notes (Signed)
Patient's wife called back from message left last night.  She was made aware of blood transfusion and son giving consent.

## 2016-05-03 NOTE — Progress Notes (Signed)
Pt was lavaged today dislodging heavy white/yellow secretions and mucus plugs.  Pt tolerated the procedure fairly well, with manageable discomfort.  Pt secretion level stayed low for a few hours but has increased as the afternoon progressed.

## 2016-05-03 NOTE — [place] FL2 (Signed)
[place] MEDICAID FL2 LEVEL OF CARE SCREENING TOOL     IDENTIFICATION  Patient Name: Scott Lawrence Birthdate: 1940/11/04 Sex: male Admission Date (Current Location): 04/07/2016  [facility] and [place] Number:  Herbalist and Address:  [facility] [place]. [facility], [place] [address], [place], [place] [address]      Provider Number: [id number]  Attending Physician Name and Address:  Cherene Altes, MD  Relative Name and Phone Number:       Current Level of Care: Hospital Recommended Level of Care: [place] Prior Approval Number:    Date Approved/Denied:   PASRR Number: [id number]  Discharge Plan: SNF    Current Diagnoses: Patient Active Problem List   Diagnosis Date Noted  . Acute on chronic respiratory failure with hypoxia ([place])   . Sepsis, unspecified organism ([place])   . Chronic hypotension   . Pleural effusion   . Ventilator dependent ([place])   . Hypokalemia   . CAD in native artery   . 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 Chronic atrial fibrillation ([place])   . Compression fracture of C-spine ([place])   . Pleural effusion, right   . Spinal fracture of T1 vertebra ([place])   . Protein-calorie malnutrition ([place])   . HCAP (healthcare-associated pneumonia) 04/30/2016  . Atrial flutter ([place])   . Tachycardia   . Chronic respiratory failure with hypoxia ([place])   . H/O tracheostomy ([place])   . Protein calorie malnutrition ([place])   . Paraplegia ([place])     Orientation RESPIRATION BLADDER Height & Weight        Vent, Tracheostomy, O2 (78m Shiley Cuffed at 40%FiO2) Indwelling catheter Weight: 98.2 kg (216 lb 7.9 oz) Height:  6' 1"  (185.4 cm)  BEHAVIORAL SYMPTOMS/MOOD NEUROLOGICAL BOWEL NUTRITION STATUS      Incontinent Feeding tube  AMBULATORY STATUS COMMUNICATION OF NEEDS Skin   Total Care   PU Stage and Appropriate Care   PU Stage 2 Dressing:  (sacrum)   PU Stage 4 Dressing:  (buttocks)               Personal Care Assistance Level of  Assistance  Bathing, Dressing, Total care Bathing Assistance: Maximum assistance   Dressing Assistance: Maximum assistance Total Care Assistance: Maximum assistance   Functional Limitations Info             SPECIAL CARE FACTORS FREQUENCY                       Contractures      Additional Factors Info  Code Status, Allergies, Insulin Sliding Scale Code Status Info: FULL Allergies Info: NKA   Insulin Sliding Scale Info: 6/day       Current Medications (05/03/2016):  This is the current hospital active medication list Current Facility-Administered Medications  Medication Dose Route Frequency Provider Last Rate Last Dose  . 0.9 %  sodium chloride infusion   Intravenous Continuous JCherene Altes MD 50 mL/hr at 05/03/16 1146    . 0.9 %  sodium chloride infusion   Intravenous Once JCherene Altes MD      . acetaminophen (TYLENOL) solution 650 mg  650 mg Per Tube Q6H PRN JCherene Altes MD   650 mg at 05/03/16 0905  . acetaminophen (TYLENOL) tablet 1,000 mg  1,000 mg Oral Once AKarma Greaser MD      . antiseptic oral rinse solution (CORINZ)  7 mL Mouth Rinse QID WRush Farmer MD  7 mL at 05/03/16 1317  . chlorhexidine gluconate (SAGE KIT) (PERIDEX) 0.12 % solution 15 mL  15 mL Mouth Rinse BID Rush Farmer, MD   15 mL at 05/03/16 0800  . collagenase (SANTYL) ointment   Topical Daily Brand Males, MD      . famotidine (PEPCID) tablet 20 mg  20 mg Per Tube BID Rahul P Desai, PA-C   20 mg at 05/03/16 0901  . guaiFENesin (ROBITUSSIN) 100 MG/5ML solution 200 mg  10 mL Oral Q6H Juanito Doom, MD   200 mg at 05/03/16 1009  . insulin aspart (novoLOG) injection 0-15 Units  0-15 Units Subcutaneous Q4H Rahul P Desai, PA-C   2 Units at 05/02/16 1200  . ipratropium-albuterol (DUONEB) 0.5-2.5 (3) MG/3ML nebulizer solution 3 mL  3 mL Nebulization Q6H Allie Bossier, MD   3 mL at 05/03/16 1418  . levalbuterol (XOPENEX) nebulizer solution 0.63 mg  0.63 mg Nebulization  Q3H PRN Rahul P Desai, PA-C      . LORazepam (ATIVAN) injection 0.5 mg  0.5 mg Intravenous BID PRN Allie Bossier, MD   0.5 mg at 05/03/16 0759  . midodrine (PROAMATINE) tablet 10 mg  10 mg Per Tube TID WC Rush Farmer, MD   10 mg at 05/03/16 1318  . ondansetron (ZOFRAN) injection 4 mg  4 mg Intravenous Q6H PRN Brand Males, MD   4 mg at 05/01/16 1028  . piperacillin-tazobactam (ZOSYN) IVPB 3.375 g  3.375 g Intravenous Q8H Rebecka Apley, RPH   3.375 g at 05/03/16 0859  . silver nitrate applicators applicator 1 Stick  1 Stick Topical PRN Allie Bossier, MD         Discharge Medications: Please see discharge summary for a list of discharge medications.  Relevant Imaging Results:  Relevant Lab Results:   Additional Information SS#: [id number]  [name], [place]

## 2016-05-03 NOTE — Progress Notes (Signed)
Echocardiogram 2D Echocardiogram has been performed.  Scott Lawrence 05/03/2016, 8:12 AM

## 2016-05-03 NOTE — Care Management Note (Signed)
Case Management Note  Patient Details  Name: JAVEION CANNEDY MRN: [id number] Date of Birth: 05/28/1941  Subjective/Objective:    CM met with wife, [name] [phone]), and dtr, Colletta [place] [phone]), at their request.  Both are concerned that pt has not progressed during the last 4 months of treatment and are questioning whether there is hope of recovery or if care is now futile.  Wife has been holding bed @ [facility] and requests input as to feasibility of returning pt to SNF.  CM talked with attending who has entered request for palliative care involvement.                     In-House Referral:   Palliative Care Team  Discharge planning Services  CM Consult  Status of Service:  In process, will continue to follow  Girard Cooter, RN 05/03/2016, 3:03 PM

## 2016-05-03 NOTE — Progress Notes (Signed)
Patient had increased bleeding during wound change.  Will call pharmacy and let them know.

## 2016-05-03 NOTE — Progress Notes (Signed)
Talk to Synetta Failnita in Pharmacy department to inform them about wound blood loss this morning, since he is on the heparin gtt.

## 2016-05-04 ENCOUNTER — Inpatient Hospital Stay (HOSPITAL_COMMUNITY): Payer: Medicare Other

## 2016-05-04 DIAGNOSIS — J90 Pleural effusion, not elsewhere classified: Secondary | ICD-10-CM | POA: Diagnosis present

## 2016-05-04 DIAGNOSIS — F411 Generalized anxiety disorder: Secondary | ICD-10-CM

## 2016-05-04 DIAGNOSIS — Z515 Encounter for palliative care: Secondary | ICD-10-CM | POA: Diagnosis present

## 2016-05-04 DIAGNOSIS — L8990 Pressure ulcer of unspecified site, unspecified stage: Secondary | ICD-10-CM | POA: Diagnosis present

## 2016-05-04 DIAGNOSIS — M4852XS Collapsed vertebra, not elsewhere classified, cervical region, sequela of fracture: Secondary | ICD-10-CM

## 2016-05-04 DIAGNOSIS — R0600 Dyspnea, unspecified: Secondary | ICD-10-CM | POA: Diagnosis present

## 2016-05-04 DIAGNOSIS — I4892 Unspecified atrial flutter: Secondary | ICD-10-CM

## 2016-05-04 DIAGNOSIS — Z7189 Other specified counseling: Secondary | ICD-10-CM | POA: Diagnosis present

## 2016-05-04 LAB — COMPREHENSIVE METABOLIC PANEL
ALK PHOS: 85 U/L (ref 38–126)
ALT: 24 U/L (ref 17–63)
AST: 19 U/L (ref 15–41)
Albumin: 1.5 g/dL — ABNORMAL LOW (ref 3.5–5.0)
Anion gap: 9 (ref 5–15)
BUN: 8 mg/dL (ref 6–20)
CALCIUM: 7.8 mg/dL — AB (ref 8.9–10.3)
CO2: 24 mmol/L (ref 22–32)
CREATININE: 0.4 mg/dL — AB (ref 0.61–1.24)
Chloride: 104 mmol/L (ref 101–111)
Glucose, Bld: 84 mg/dL (ref 65–99)
Potassium: 2.9 mmol/L — ABNORMAL LOW (ref 3.5–5.1)
Sodium: 137 mmol/L (ref 135–145)
Total Bilirubin: 1 mg/dL (ref 0.3–1.2)
Total Protein: 6 g/dL — ABNORMAL LOW (ref 6.5–8.1)

## 2016-05-04 LAB — TYPE AND SCREEN
ABO/RH(D): A POS
ANTIBODY SCREEN: NEGATIVE
UNIT DIVISION: 0
Unit division: 0
Unit division: 0

## 2016-05-04 LAB — CBC
HCT: 29.5 % — ABNORMAL LOW (ref 39.0–52.0)
Hemoglobin: 9.1 g/dL — ABNORMAL LOW (ref 13.0–17.0)
MCH: 26 pg (ref 26.0–34.0)
MCHC: 30.8 g/dL (ref 30.0–36.0)
MCV: 84.3 fL (ref 78.0–100.0)
Platelets: 360 10*3/uL (ref 150–400)
RBC: 3.5 MIL/uL — ABNORMAL LOW (ref 4.22–5.81)
RDW: 16.8 % — ABNORMAL HIGH (ref 11.5–15.5)
WBC: 17.7 10*3/uL — ABNORMAL HIGH (ref 4.0–10.5)

## 2016-05-04 LAB — CULTURE, BLOOD (ROUTINE X 2)
CULTURE: NO GROWTH
Culture: NO GROWTH

## 2016-05-04 LAB — BASIC METABOLIC PANEL
Anion gap: 7 (ref 5–15)
BUN: 8 mg/dL (ref 6–20)
CO2: 24 mmol/L (ref 22–32)
CREATININE: 0.43 mg/dL — AB (ref 0.61–1.24)
Calcium: 7.8 mg/dL — ABNORMAL LOW (ref 8.9–10.3)
Chloride: 105 mmol/L (ref 101–111)
GFR calc Af Amer: >60 mL/min (ref >60)
GLUCOSE: 88 mg/dL (ref 65–99)
Potassium: 3.5 mmol/L (ref 3.5–5.1)
SODIUM: 136 mmol/L (ref 135–145)

## 2016-05-04 LAB — PROTEIN, BODY FLUID: Total protein, fluid: <3 g/dL

## 2016-05-04 LAB — GLUCOSE, CAPILLARY
GLUCOSE-CAPILLARY: 94 mg/dL (ref 65–99)
GLUCOSE-CAPILLARY: 99 mg/dL (ref 65–99)
GLUCOSE-CAPILLARY: 100 mg/dL — AB (ref 65–99)
Glucose-Capillary: 91 mg/dL (ref 65–99)
Glucose-Capillary: 91 mg/dL (ref 65–99)

## 2016-05-04 LAB — MAGNESIUM: Magnesium: 1.8 mg/dL (ref 1.7–2.4)

## 2016-05-04 MED ORDER — MAGNESIUM SULFATE IN D5W 1-5 GM/100ML-% IV SOLN
1.0000 g | Freq: Once | INTRAVENOUS | Status: AC
  Administered 2016-05-04: 1 g via INTRAVENOUS
  Filled 2016-05-04: qty 100

## 2016-05-04 MED ORDER — LORAZEPAM 2 MG/ML IJ SOLN: 0.5000 mg | Freq: Four times a day (QID) | INTRAMUSCULAR | Status: DC | PRN

## 2016-05-04 MED ORDER — MORPHINE SULFATE (PF) 2 MG/ML IV SOLN
1.0000 mg | INTRAVENOUS | Status: DC | PRN
  Administered 2016-05-05: 1 mg via INTRAVENOUS
  Filled 2016-05-04: qty 1

## 2016-05-04 MED ORDER — SODIUM CHLORIDE 0.9 % IV SOLN
1.0000 g | Freq: Three times a day (TID) | INTRAVENOUS | Status: DC
  Administered 2016-05-04 – 2016-05-05 (×3): 1 g via INTRAVENOUS
  Filled 2016-05-04 (×5): qty 1

## 2016-05-04 MED ORDER — MORPHINE SULFATE (PF) 2 MG/ML IV SOLN: 2.0000 mg | INTRAVENOUS | Status: DC | PRN

## 2016-05-04 MED ORDER — BUDESONIDE 0.5 MG/2ML IN SUSP
0.5000 mg | Freq: Two times a day (BID) | RESPIRATORY_TRACT | Status: DC
  Administered 2016-05-04 – 2016-05-08 (×8): 0.5 mg via RESPIRATORY_TRACT
  Filled 2016-05-04 (×8): qty 2

## 2016-05-04 MED ORDER — IPRATROPIUM-ALBUTEROL 0.5-2.5 (3) MG/3ML IN SOLN
3.0000 mL | RESPIRATORY_TRACT | Status: DC
Start: 2016-05-04 — End: 2016-05-05
  Administered 2016-05-04 – 2016-05-05 (×5): 3 mL via RESPIRATORY_TRACT
  Filled 2016-05-04 (×5): qty 3

## 2016-05-04 MED ORDER — LORAZEPAM 2 MG/ML IJ SOLN
0.5000 mg | Freq: Two times a day (BID) | INTRAMUSCULAR | Status: DC
  Administered 2016-05-04 – 2016-05-06 (×4): 0.5 mg via INTRAVENOUS
  Filled 2016-05-04 (×4): qty 1

## 2016-05-04 MED ORDER — POTASSIUM CHLORIDE 20 MEQ/15ML (10%) PO SOLN
60.0000 meq | Freq: Once | ORAL | Status: AC
  Administered 2016-05-04: 60 meq via ORAL
  Filled 2016-05-04: qty 45

## 2016-05-04 NOTE — Progress Notes (Signed)
eLink Physician-Brief Progress Note Patient Name: Scott Lawrence DOB: 1940/12/04 MRN: [id number]   Date of Service  05/04/2016  HPI/Events of Note  K+ = 2.9 and Creatinine = 0.4.  eICU Interventions  Will order: 1. Replete K+. 2. Repeat BMP at 12 noon.     Intervention Category Intermediate Interventions: Electrolyte abnormality - evaluation and management  [name] Eugene 05/04/2016, 5:53 AM

## 2016-05-04 NOTE — Care Management Note (Addendum)
Case Management Note  Patient Details  Name: Melanie CrazierScott H Leeds MRN: [id number] Date of Birth: 1940-12-23  Subjective/Objective:     Wife and daughter have talked with palliative care NP and wife requests CM call [facility] to determine whether they would agree to take a ventilator pt in their palliative care unit.  TC to [facility] geologistmedical director of palliative care unit will call CM to discuss.  CM also talked with CSW who will determine if residential hospice facilities will take pt on vent with objective being comfort care.                            In-House Referral:  Hospice / Palliative Care, Clinical Social Work  Discharge planning Services  CM Consult  Status of Service:  In process, will continue to follow  Magdalene RiverMayo, Jamaal Bernasconi T, RN 05/04/2016, 3:35 PM  05/05/2016 0734 AM:  Received call from Dr Nedra HaiLee, medical director of [facility] palliative care unit, who discusses case and declines pt transfer.  Per CSW, [facility] option if family is clear about plan to pursue comfort care and wean from vent.

## 2016-05-04 NOTE — Progress Notes (Signed)
CSW called to help explore possible placement options for patient  CSW [facility] with [facility] in regards to Ventilator use at hospice facility.  Marylene [facility]ngela reports that [facility]- they go through [facility] to have ventilator brought into the facility and then home health RNs would manage while pt is on vent settings- there would need to be an understanding that pt could not stay permanently on ventilator at hospice and that plan would be to remove or wean with focus on comfort  CSW will continue to follow for additional needs or return to [facility] pending family decisions  Merlyn LotJenna Holoman, [facility] Clinical Social Worker [phone]

## 2016-05-04 NOTE — Progress Notes (Signed)
PULMONARY / CRITICAL CARE MEDICINE   Name: Scott Lawrence MRN: [id number] DOB: 10-Dec-1940    ADMISSION DATE:  04/09/2016 CONSULTATION DATE:  04/30/16  REFERRING MD:  Rhunette CroftNanavati (EDP)  CHIEF COMPLAINT:  Hypotension  BRIEF 75 y/o male s/p C-spine injury 12/2015 [facility], [facility] septic shock from HCAP.    SUBJECTIVE:  Less trache secretions Feels better.   VITAL SIGNS: BP 137/66 mmHg  Pulse 100  Temp(Src) 100.3 F (37.9 C) (Oral)  Resp 24  Ht 6\' 1"  (1.854 m)  Wt 98.3 kg (216 lb 11.4 oz)  BMI 28.60 kg/m2  SpO2 93%  HEMODYNAMICS:    VENTILATOR SETTINGS: Vent Mode:  [-] PRVC FiO2 (%):  [40 %] 40 % Set Rate:  [18 bmp] 18 bmp Vt Set:  [500 mL] 500 mL PEEP:  [5 cmH20] 5 cmH20 Plateau Pressure:  [12 cmH20-26 cmH20] 24 cmH20  INTAKE / OUTPUT: I/O last 3 completed shifts: In: 5302.6 [I.V.:3510.6; Blood:335; Other:160; NG/GT:247; IV Piggyback:1050] Out: 2610 [Urine:2610]   PHYSICAL EXAMINATION: General: Chronically ill appearing adult male, tachypneic on vent > less than 7/3 HENT: trach in place, hard collar in place PULM: diminished RLL, increased respiratory effort. Rhonchi in BLF. CV: Tachy, regular Belly: soft, nontender, non-distended MSK: diminished bulk/tone Neuro: opens eyes to voice  LABS:  BMET  Recent Labs Lab 05/03/16 0654 05/04/16 0420 05/04/16 1124  NA 136 137 136  K 3.4* 2.9* 3.5  CL 104 104 105  CO2 26 24 24   BUN 7 8 8   CREATININE 0.33* 0.40* 0.43*  GLUCOSE 98 84 88    Electrolytes  Recent Labs Lab 04/30/16 0241  05/02/16 0341 05/03/16 0654 05/04/16 0420 05/04/16 1124  CALCIUM 7.5*  < > 7.7* 8.0* 7.8* 7.8*  MG 1.8  --  1.8 1.8  --  1.8  PHOS 3.2  --   --   --   --   --   < > = values in this interval not displayed.  CBC  Recent Labs Lab 05/02/16 0833 05/03/16 0654 05/04/16 0420  WBC 10.4 14.2* 17.7*  HGB 7.3* 7.1* 9.1*  HCT 24.1* 23.2* 29.5*  PLT 340 357 360    Coag's No  results for input(s): APTT, INR in the last 168 hours.  Sepsis Markers  Recent Labs Lab 04/09/2016 1840 04/25/2016 2252 04/30/16 0241 05/01/16 0239 05/02/16 0341  LATICACIDVEN 1.61 1.60  --   --  1.0  PROCALCITON  --   --  0.18 0.12  --     ABG  Recent Labs Lab 04/30/16 0234 04/30/16 0516  PHART 7.472* 7.406  PCO2ART 44.0 50.1*  PO2ART 64.0* 99.0    Liver Enzymes  Recent Labs Lab 05/02/16 0341 05/03/16 0654 05/04/16 0420  AST 19 21 19   ALT 27 28 24   ALKPHOS 85 89 85  BILITOT 0.2* 0.8 1.0  ALBUMIN 1.4* 1.6* 1.5*    Cardiac Enzymes No results for input(s): TROPONINI, PROBNP in the last 168 hours.  Glucose  Recent Labs Lab 05/03/16 1849 05/03/16 1915 05/03/16 2328 05/04/16 0408 05/04/16 0820 05/04/16 1208  GLUCAP 90 105* 91 91 94 91    Imaging Dg Chest Port 1 View  05/04/2016  CLINICAL DATA:  Pneumonia EXAM: PORTABLE CHEST 1 VIEW COMPARISON:  05/03/2016 FINDINGS: Diffuse airspace disease again noted with moderate right pleural effusion. Suspect small left effusion. No real change since prior study. Mild cardiomegaly. Tracheostomy tube is unchanged. IMPRESSION: Stable diffuse bilateral airspace disease and  effusions. Electronically Signed   By: Charlett NoseKevin  Dover M.D.   On: 05/04/2016 07:46   Dg Chest Port 1 View  05/03/2016  CLINICAL DATA:  Status post right-sided thoracentesis EXAM: PORTABLE CHEST 1 VIEW COMPARISON:  May 01, 2016 FINDINGS: No pneumothorax. Right pleural effusion is slightly smaller following thoracentesis. There is new patchy airspace disease throughout the left mid lower lung zones. Patchy airspace opacity is also noted in the right mid and lower lung zones, similar to recent study. Heart is borderline enlarged with pulmonary vascularity within normal limits. Tracheostomy catheter tip is 7.8 cm above the carina. There is postoperative change in the visualized cervical and upper thoracic spine regions. IMPRESSION: No pneumothorax. Airspace consolidation  in both mid and lower lung zones, new on the left and essentially stable on the right. Stable cardiac silhouette. There is persistent right pleural effusion, although pleural effusion appears marginally smaller after thoracentesis. Electronically Signed   By: Bretta BangWilliam  Woodruff III M.D.   On: 05/03/2016 15:59   [place] Thoracentesis Asp Pleural Space W/img Guide  05/04/2016  INDICATION: Symptomatic right sided pleural effusion EXAM: US THORACENTESIS ASP PLEURAL SPACE W/IMG GUIDE COMPARISON:  None. MEDICATIONS: 10 cc 1% lidocaine COMPLICATIONS: None immediate. TECHNIQUE: Informed written consent was obtained from the patient after a discussion of the risks, benefits and alternatives to treatment. A timeout was performed prior to the initiation of the procedure. Initial ultrasound scanning demonstrates a right pleural effusion. The lower chest was prepped and draped in the usual sterile fashion. 1% lidocaine was used for local anesthesia. Under direct ultrasound guidance, a 19 gauge, 7-cm, Yueh catheter was introduced. An ultrasound image was saved for documentation purposes. The thoracentesis was performed. The catheter was removed and a dressing was applied. The patient tolerated the procedure well without immediate post procedural complication. The patient was escorted to have an upright chest radiograph. FINDINGS: A total of approximately 320 cc of yellow fluid was removed. Requested samples were sent to the laboratory. IMPRESSION: Successful ultrasound-guided right sided thoracentesis yielding 320 cc of pleural fluid. Read by:  Robet LeuPamela A Turpin [facility] Electronically Signed   By: Jolaine ClickArthur  Hoss M.D.   On: 05/03/2016 15:58     STUDIES:  CXR 6/29 > right mid to lower consolidation  CTA chest 6/29 > negative for PE, mod right sided pleural effusion with underlying opacity.  CULTURES: Blood 6/29 >> (-) Urine 6/29 >> (-) Sputum 6/29 >> PSA  ANTIBIOTICS: Vanc 6/29 >> 7/3 Zosyn 6/29 >>  SIGNIFICANT EVENTS: 6/29 >  admitted with HCAP.  LINES/TUBES: Trach (March 2017) >> PEG (March 2017) >>  DISCUSSION: 75 y.o. M who is paraplegic following C6 / C7 injury in March 2017 with resultant trach / PEG and currently resides at [facility] who has HCAP.  He has ongoing pulmonary toilette issues as of 7/3.  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 Can switch to Cipro PO based on sensitivities.  Wound consult pending for sacral pressure ulcer  PULMONARY A: Chronic respiratory failure - s/p trach March 2017 HCAP 2/2 PSA Pulmonary toilette problems, tachypnea 7/3 P:   Full vent support> don't wean Continue HCAP treatment > as mentioned, needs at least 2 weeks Abx Start bag/lavage qshift Repeat CXR 7/5 Inc duoneb to q4. Add pulmicort. Consider CPT q4 via bed and/or mucomyst q4 if with inc secretions/plugging.    Scott LandryJ. Angelo A  Christene Slates, MD 05/04/2016, 1:05 PM [place] [facility] Pager [phone] After 3 pm or if no answer, call [phone]

## 2016-05-04 NOTE — Progress Notes (Signed)
Pharmacy Antibiotic Note  Scott CrazierScott H Lawrence is a [age] y.o. male admitted on 04/09/2016 with sepsis from [facility]. The patient was originally started on Vancomycin + Zosyn on 6/29. This was narrowed to Zosyn monotherapy on 7/3 given MRSA PCR negative and GNR in RCx. The respiratory cultures have since resulted as PSA - sensitive to Zosyn however the patient continues with persistent fevers and an elevated WBC count so plans are now to transition the patient to Meropenem (PSA is also sensitive to this)   SCr 0.4 << 0.33 - CrCl hard to estimate given paraplegia but will watch bumps in SCr closely to determine if medication adjustments are needed. The patient is s/p a thoracentesis on 7/3 - and cultures were supposedly sent. Will await results of these.  Plan: 1. D/c Zosyn 2. Start Meropenem 1g IV every 8 hours 3. Will continue to follow renal function, culture results, LOT, and antibiotic de-escalation plans   Height: 6\' 1"  (185.4 cm) Weight: 216 lb 11.4 oz (98.3 kg) IBW/kg (Calculated) : 79.9  Temp (24hrs), Avg:99.3 F (37.4 C), Min:97.8 F (36.6 C), Max:102.9 F (39.4 C)   Recent Labs Lab 04/02/2016 1840 04/22/2016 2252 04/30/16 0241 05/01/16 0239 05/01/16 1607 05/02/16 0341 05/02/16 0833 05/03/16 0654 05/04/16 0420  WBC  --   --  15.2* 10.2  --   --  10.4 14.2* 17.7*  CREATININE  --   --  0.36* <0.30*  --  <0.30*  --  0.33* 0.40*  LATICACIDVEN 1.61 1.60  --   --   --  1.0  --   --   --   VANCOTROUGH  --   --   --   --  10*  --   --   --   --     Estimated Creatinine Clearance: 101.5 mL/min (by C-G formula based on Cr of 0.4).    No Known Allergies  Antimicrobials this admission: Vanc 6/29 >> 7/3 Zosyn 6/29 >> 7/4 Mero 7/4 >>  Dose adjustments this admission: n/a  Microbiology results: 6/30 MRSA PCR >> negative  6/29 BCx >> ngtd 6/29 UCx >> NG 7/1 RCx (TA) >> PSA (S-Cipro/Gent/Imi/Zosyn, I-Cefepime/Ceftaz)  Thank you for allowing pharmacy to be a part of this patient's  care.  Georgina PillionElizabeth Eduin Friedel, PharmD, BCPS Clinical Pharmacist Pager: [phone] 05/04/2016 10:32 AM

## 2016-05-04 NOTE — Progress Notes (Signed)
Pt was transported to and from CT without incident.

## 2016-05-04 NOTE — Progress Notes (Signed)
PROGRESS NOTE    Scott Lawrence  MLJ:[id number] DOB: [birth year] DOA: 04/22/2016 PCP: Verneita Griffes, MD   Brief Narrative:  75 y.o. male with PMHx CAD native artery, STEMI 12/29/02, Hypotension, HLD, S/P fracture wrist 06/05/14, , Pneumothorax,C6 / C7 injury in March 2017 with resulting paraplegic status and tracheostomy / PEG (performed at [facility]).   He presented to [facility] ED 6/29 from [facility] due to hypotension and tachycardia. SBP was in the 70's but typically runs in low 100's and pt is chronically on midodrine.  In ED, he remained mildly hypotensive (compared to his norm) with SBP in 90's. He was started on low dose levophed and SBP improved to around 110. CXR revealed right mid to lower consolidation and CTA showed mod right sided pleural effusion with underlying opacity.  PCCM was called for admission of HCAP.   Assessment & Plan:   Active Problems:   HCAP (healthcare-associated pneumonia)   Chronic atrial fibrillation (HCC)   Compression fracture of C-spine (HCC)   Pleural effusion, right   Spinal fracture of T1 vertebra (HCC)   Protein-calorie malnutrition (HCC)   Acute on chronic respiratory failure with hypoxia (HCC)   Sepsis, unspecified organism (HCC)   Chronic hypotension   Pleural effusion   Ventilator dependent (HCC)   Hypokalemia   CAD in native artery   Paraplegic spinal paralysis (HCC)   Pressure ulcer   Palliative care encounter   Goals of care, counseling/discussion   Encounter for hospice care discussion   Anxiety state   Dyspnea   Loculated pleural effusion   Pleural effusion on right  Sepsis unspecified organism/HCAP/.positive pseudomonas aeruginosa/GNR -Leukocytosis trending up changed to Meropenem  -Pleural fluid pending  Pleural effusion -7/3 Right thoracentesis; only removed 379m -Post thoracentesis x-ray showed significant Right pleural fluid remaining most likely loculated. Chest CT pending -Consulted Dr. HRoxan HockeyCardiothoracic surgeon  possible VATS   Chronic respiratory failure - s/p trach March 2017/ventilator dependent. -Per patient/wife [facility] has been attempting to wean patient; at times tolerating 3-4 days off vent, then usually relapsing with infection or atelectasis/inability clear secretions. -Continue full vent support. -Tracheal suction QID -DuoNeb QID -Mucinex DM BID  Chronic diastolic CHF -Unable to place patient on traditional CHF drug secondary to hypotension  Pulmonary hypertension -See chronic diastolic CHF  Chronic A.fib (CHA2DS2VASC score 2)/CAD/STEMI. -Not on anticoagulation per outpatient MAR  -Continue heparin gtt . -Echocardiogram pending -Strict in and out since admission +6.4 L -Daily weight Filed Weights   05/03/16 0307 05/03/16 0405 05/04/16 0416  Weight: 98.431 kg (217 lb) 98.2 kg (216 lb 7.9 oz) 98.3 kg (216 lb 11.4 oz)    Chronic Hypotension(SBP usually runs in low 100's.) -Normal saline 50 ml/hr -Midodrin 10 mg TID -A.m. cortisol: 10.4 normal .   Hypokalemia -Potassium goal> 4 -Potassium solution 60 mEq  Hypomagnesemia -Magnesium goal> 2 -Magnesium IV 1 g  Protein calorie malnutrition. -NPO.  Anemia.  Transfuse for Hgb < 8.  Recent Labs Lab 04/30/16 0241 05/01/16 0239 05/02/16 0833 05/03/16 0654 05/04/16 0420  HGB 8.2* 7.2* 7.3* 7.1* 9.1*  -7/2 transfuse 1 unit PRBC  DM Type 2 Controlled with complication -7/2 hemoglobin A1c= 6.0 -Lipid panel; within ADA guidelines  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 -C-spine/T-spine x-ray pending. [place] will review x-ray and if appears normal contact neurosurgery for clearance to discontinue collar . -Spoke with Dr. GKary KosNeurosurgery  and reviewed films. Agrees that hardware looks good, HOWEVER since surgery was only ~  3 months ago patient MUST remain in [place] collar. Stated once we have patient's pneumonia under control obtain CT of the C-spine and he would be glad  to evaluate if time for collar to come off.   Goals of care -7/4 Palliative care spoke with wife today, and unsure she wants to continue aggressive care. On 7/6 another phone conference with wife and children to make final decision will be conducted following CT of chest + cardiothoracic surgery recommendations have been completed.    DVT prophylaxis: Heparin drip  Code Status: Full  Family Communication: None  Disposition Plan: See goals of care   Consultants:  [facility]  phone consult Dr. Kary Kos Neurosurgery Dr. Roxan Hockey Cardiothoracic surgeon   Procedures/Significant Events:  3/6 Ct Head W/o Contrast (at [facility] ): - frontal abnormality right inferior frontal lobe artifact  vs contusion  3/6 Ct Spine Cervical Wo [facility] ):-severe cervical spine injury at the C6/C7 disc space. -Grade 2/3 anterolisthesis in conjunction with bilateral dislocated, locked facets. -Associated fracture right inferior C6 facet,Severe cord compression as a result.  -Suspect subtle marginal avulsion fracture along the anterior superior corner of the C7 vertebral body in relation to anterior longitudinal ligament. 3/7Ct Cta Chest W/o [facility] ): -Sternal fracture with associated chest wall and mediastinal hematoma.-Posterior pneumomediastinum closely associated with esophageal wall. -Centrally located focus of gas which appears to be within the esophageal wall,concerning for esophageal injury. - -Trace associated right pneumothorax at the superomedial aspect.  3/7 fusion posterior thoracic; C4-T3 POSTERIOR CERVICAL THORACIC FUSION BY DR. [facility] ) 3/7 EGD (at [facility] )[place] performed by [place], Maia Petties, 3/13(at [facility] ) Tracheostomy  performed by Dr Theora Gianotti., 6/29 CXR > right mid to lower consolidation  6/29 CTA chest > mod right sided pleural effusion with underlying opacity  7/1 C-spine  x-ray clearing;- Abnormal alignment is seen at C6-7.  7/1 T-spine x-ray:-Mild compression deformities of T7 and T8-8 as well as mild depression of the superior endplate of T4 unchanged from the recent CT scan and age indeterminate. - Posterior fusion hardware from approximately C4-T3 intact. -7/2 transfuse 1 unit PRBC 7/3 Right thoracentesis; 320 ml aspirated 7/3 echocardiogram;- LVEF= 55% to 60%. --(grade 2 diastolic dysfunction). -- Pulmonary arteries: PA peak pressure: 65 mm Hg (S).  Cultures 6/29 blood left forearm/hand negative 6/29 Urine positive insignificant growth 7/1 tracheal aspirate positive pseudomonas aeruginosa/GNR   Antimicrobials: Vanc 6/29 > Zosyn 6/29 >   Devices    LINES / TUBES:  Trach (March 2017) > PEG (March 2017) >    Continuous Infusions: . sodium chloride 50 mL/hr at 05/04/16 0926     Subjective: 7/4   MAXIMUM TEMPERATURE last 24 hours= 39.4C, increasing leukocytosis. A/O 4 after receiving news of his thoracentesis would like to speak with his wife to determine further course of treatment.     Objective: Filed Vitals:   05/04/16 1200 05/04/16 1326 05/04/16 1616 05/04/16 1629  BP: 137/66  119/76 105/57  Pulse: 100  89 111  Temp: 100.3 F (37.9 C) 100 F (37.8 C)  97.9 F (36.6 C)  TempSrc: Oral Oral  Oral  Resp: _0 Height:      Weight:      SpO2: 93%  100% 94%    Intake/Output Summary (Last 24 hours) at 05/04/16 1939 Last data filed at 05/04/16 1853  Gross per 24 hour  Intake   1510 ml  Output    835 ml  Net    675 ml   Filed Weights   05/03/16 0307 05/03/16 0405 05/04/16 0416  Weight: 98.431 kg (217 lb) 98.2 kg (216 lb 7.9 oz) 98.3 kg (216 lb 11.4 oz)    Examination:  General: A/O 4, answers All questions , acute on chronic respiratory distress Eyes: negative scleral hemorrhage, negative anisocoria, negative icterus ENT: Negative Runny nose, negative gingival bleeding, Neck:  Negative scars, masses,  torticollis, lymphadenopathy, JVD,  #6 cuffed trach in place Lungs: bilateral upper lobes clear to auscultation, unable to appreciate breath sounds RLL, without wheezes or crackles Cardiovascular: Regular rate and rhythm without murmur gallop or rub normal S1 and S2 Abdomen: negative abdominal pain, nondistended, positive soft, bowel sounds, no rebound, no ascites, no appreciable mass, PEG tube in place negative sign of infection Extremities: No significant cyanosis, clubbing, or edema bilateral lower extremities Skin: Negative rashes, lesions, ulcers Psychiatric:  Negative depression, negative anxiety, negative fatigue, negative mania  Central nervous system:  Cranial nerves II through XII intact, tongue/uvula midline, bilateral upper extremity muscle strength 5/5, bilateral lower extremity muscle strength 0/5, negative sensation,  negative dysarthria, negative expressive aphasia, negative receptive aphasia.  .     Data Reviewed: Care during the described time interval was provided by me .  I have reviewed this patient's available data, including medical history, events of note, physical examination, and all test results as part of my evaluation. I have personally reviewed and interpreted all radiology studies.  CBC:  Recent Labs Lab 04/16/2016 1825 04/30/16 0241 05/01/16 0239 05/02/16 0833 05/03/16 0654 05/04/16 0420  WBC 15.7* 15.2* 10.2 10.4 14.2* 17.7*  NEUTROABS 12.8*  --   --   --   --   --   HGB 7.9* 8.2* 7.2* 7.3* 7.1* 9.1*  HCT 26.5* 27.3* 24.4* 24.1* 23.2* 29.5*  MCV 86.3 87.5 85.9 86.7 85.9 84.3  PLT 418* 429* 338 340 357 834   Basic Metabolic Panel:  Recent Labs Lab 04/30/16 0241 05/01/16 0239 05/02/16 0341 05/03/16 0654 05/04/16 0420 05/04/16 1124  NA 134* 137 137 136 137 136  K 3.4* 3.3* 3.0* 3.4* 2.9* 3.5  CL 100* 104 103 104 104 105  CO2 _0 GLUCOSE 119* 110* 128* 98 84 88  BUN _1 CREATININE 0.36* <0.30* <0.30* 0.33* 0.40*  0.43*  CALCIUM 7.5* 7.9* 7.7* 8.0* 7.8* 7.8*  MG 1.8  --  1.8 1.8  --  1.8  PHOS 3.2  --   --   --   --   --    GFR: Estimated Creatinine Clearance: 101.5 mL/min (by C-G formula based on Cr of 0.43). Liver Function Tests:  Recent Labs Lab 04/09/2016 1825 05/02/16 0341 05/03/16 0654 05/04/16 0420  AST 37 _2 ALT 43 _3 ALKPHOS 111 85 89 85  BILITOT 0.4 0.2* 0.8 1.0  PROT 6.5 5.6* 5.8* 6.0*  ALBUMIN 1.7* 1.4* 1.6* 1.5*   No results for input(s): LIPASE, AMYLASE in the last 168 hours. No results for input(s): AMMONIA in the last 168 hours. Coagulation Profile: No results for input(s): INR, PROTIME in the last 168 hours. Cardiac Enzymes: No results for input(s): CKTOTAL, CKMB, CKMBINDEX, TROPONINI in the last 168 hours. BNP (last 3 results) No results for input(s): PROBNP in the last 8760 hours. HbA1C:  Recent Labs  05/02/16 0341  HGBA1C 6.0*   CBG:  Recent Labs Lab  05/03/16 2328 05/04/16 0408 05/04/16 0820 05/04/16 1208 05/04/16 1625  GLUCAP 91 91 94 91 99   Lipid Profile:  Recent Labs  05/02/16 0341  CHOL 98  HDL 25*  LDLCALC 60  TRIG 64  CHOLHDL 3.9   Thyroid Function Tests: No results for input(s): TSH, T4TOTAL, FREET4, T3FREE, THYROIDAB in the last 72 hours. Anemia Panel: No results for input(s): VITAMINB12, FOLATE, FERRITIN, TIBC, IRON, RETICCTPCT in the last 72 hours. Urine analysis:    Component Value Date/Time   COLORURINE AMBER* 04/05/2016 1840   APPEARANCEUR CLOUDY* 04/20/2016 1840   LABSPEC 1.029 04/17/2016 1840   PHURINE 7.0 04/21/2016 1840   GLUCOSEU NEGATIVE 04/18/2016 1840   HGBUR NEGATIVE 04/12/2016 1840   BILIRUBINUR NEGATIVE 04/27/2016 1840   KETONESUR NEGATIVE 04/12/2016 1840   PROTEINUR 30* 04/08/2016 1840   NITRITE NEGATIVE 04/07/2016 1840   LEUKOCYTESUR SMALL* 04/11/2016 1840   Sepsis Labs: @LABRCNTIP(procalcitonin:4,lacticidven:4)  ) Recent Results (from the past 240 hour(s))  Blood Culture (routine x 2)      Status: None   Collection Time: 04/27/2016  6:20 PM  Result Value Ref Range Status   Specimen Description BLOOD LEFT HAND  Final   Special Requests BOTTLES DRAWN AEROBIC ONLY 5CC  Final   Culture NO GROWTH 5 DAYS  Final   Report Status 05/04/2016 FINAL  Final  Blood Culture (routine x 2)     Status: None   Collection Time: 04/02/2016  6:20 PM  Result Value Ref Range Status   Specimen Description BLOOD LEFT FOREARM  Final   Special Requests IN PEDIATRIC BOTTLE 3CC  Final   Culture NO GROWTH 5 DAYS  Final   Report Status 05/04/2016 FINAL  Final  Urine culture     Status: Abnormal   Collection Time: 04/01/2016  6:47 PM  Result Value Ref Range Status   Specimen Description URINE, CATHETERIZED  Final   Special Requests NONE  Final   Culture <10,000 COLONIES/mL INSIGNIFICANT GROWTH (A)  Final   Report Status 05/01/2016 FINAL  Final  MRSA PCR Screening     Status: None   Collection Time: 04/30/16  4:40 AM  Result Value Ref Range Status   MRSA by PCR NEGATIVE NEGATIVE Final    Comment:        The GeneXpert MRSA Assay (FDA approved for NASAL specimens only), is one component of a comprehensive MRSA colonization surveillance program. It is not intended to diagnose MRSA infection nor to guide or monitor treatment for MRSA infections.   Urine culture     Status: None   Collection Time: 04/30/16 11:27 AM  Result Value Ref Range Status   Specimen Description URINE, CATHETERIZED  Final   Special Requests NONE  Final   Culture NO GROWTH  Final   Report Status 05/01/2016 FINAL  Final  Culture, respiratory (NON-Expectorated)     Status: None (Preliminary result)   Collection Time: 05/01/16 10:17 AM  Result Value Ref Range Status   Specimen Description TRACHEAL ASPIRATE  Final   Special Requests NONE  Final   Gram Stain   Final    ABUNDANT WBC PRESENT, PREDOMINANTLY PMN NO SQUAMOUS EPITHELIAL CELLS SEEN FEW GRAM NEGATIVE RODS RARE GRAM NEGATIVE COCCI IN PAIRS    Culture   Final     ABUNDANT PSEUDOMONAS AERUGINOSA ABUNDANT GRAM NEGATIVE RODS IDENTIFICATION AND SUSCEPTIBILITIES TO FOLLOW    Report Status PENDING  Incomplete   Organism ID, Bacteria PSEUDOMONAS AERUGINOSA  Final      Susceptibility   Pseudomonas   aeruginosa - MIC*    CEFTAZIDIME 16 INTERMEDIATE Intermediate     CIPROFLOXACIN 1 SENSITIVE Sensitive     GENTAMICIN 2 SENSITIVE Sensitive     IMIPENEM 2 SENSITIVE Sensitive     PIP/TAZO 32 SENSITIVE Sensitive     CEFEPIME 8 INTERMEDIATE Intermediate     * ABUNDANT PSEUDOMONAS AERUGINOSA         Radiology Studies: Ct Chest Wo Contrast  05/04/2016  CLINICAL DATA:  Cervical spine injury in March of 2017. Presented to [facility] on June 29th with septic shock. History of pneumothorax. EXAM: CT CHEST WITHOUT CONTRAST TECHNIQUE: Multidetector CT imaging of the chest was performed following the standard protocol without IV contrast. COMPARISON:  Chest CT dated 04/05/2016 FINDINGS: Mediastinum/Lymph Nodes: Tracheostomy appears adequately positioned with tip above the level of the carina. Scattered atherosclerotic changes noted along the walls of the normal caliber thoracic aorta. Heart size is upper normal, stable. No pericardial effusion. Coronary artery calcifications and presumed stent again noted. Scattered small and moderate-sized lymph nodes noted throughout the mediastinum. Esophagus is unremarkable. Trachea and central bronchi are unremarkable. Lungs/Pleura: Again noted is a moderate-sized right pleural effusion. Adjacent dense consolidation is presumably compressive atelectasis. There is a new left pleural effusion which is small to moderate in size with adjacent atelectasis. Diffuse consolidations are seen throughout the upper lobes, and to a lesser degree in the lower lungs bilaterally, majority of which has a ground-glass density. No pneumothorax seen. Upper abdomen: No acute findings. Percutaneous gastrostomy tube appears adequately positioned.  Musculoskeletal: Healing fracture within the sternum. Fixation hardware incompletely imaged at the cervical thoracic junction. Stable compression deformity of the T7 vertebral body, of uncertain age. IMPRESSION: 1. Right pleural effusion, moderate in size, stable. Adjacent dense consolidation within the right lower lobe is stable and most likely compressive atelectasis. 2. New left pleural effusion, small to moderate in size, also with adjacent compressive atelectasis. 3. Diffuse consolidations throughout the remainder of the lungs, majority with a ground-glass density. Differential includes atypical pneumonias such as viral or fungal, interstitial pneumonias, edema related to volume overload/CHF, sequela of aspiration and ARDS. 4. Numerous small and moderate-sized lymph nodes within the mediastinum, most likely reactive in nature. 5. Fixation hardware incompletely imaged at the cervical thoracic junction. Stable mild compression deformity of the T7 vertebral body, again of uncertain age. These results will be called to the ordering clinician or representative by the Radiologist Assistant, and communication documented in the PACS or zVision Dashboard. Electronically Signed   By: Stan  Maynard M.D.   On: 05/04/2016 16:46   Dg Chest Port 1 View  05/04/2016  CLINICAL DATA:  Pneumonia EXAM: PORTABLE CHEST 1 VIEW COMPARISON:  05/03/2016 FINDINGS: Diffuse airspace disease again noted with moderate right pleural effusion. Suspect small left effusion. No real change since prior study. Mild cardiomegaly. Tracheostomy tube is unchanged. IMPRESSION: Stable diffuse bilateral airspace disease and effusions. Electronically Signed   By: Kevin  Dover M.D.   On: 05/04/2016 07:46   Dg Chest Port 1 View  05/03/2016  CLINICAL DATA:  Status post right-sided thoracentesis EXAM: PORTABLE CHEST 1 VIEW COMPARISON:  May 01, 2016 FINDINGS: No pneumothorax. Right pleural effusion is slightly smaller following thoracentesis. There is new  patchy airspace disease throughout the left mid lower lung zones. Patchy airspace opacity is also noted in the right mid and lower lung zones, similar to recent study. Heart is borderline enlarged with pulmonary vascularity within normal limits. Tracheostomy catheter tip is 7.8 cm above the carina. There is   postoperative change in the visualized cervical and upper thoracic spine regions. IMPRESSION: No pneumothorax. Airspace consolidation in both mid and lower lung zones, new on the left and essentially stable on the right. Stable cardiac silhouette. There is persistent right pleural effusion, although pleural effusion appears marginally smaller after thoracentesis. Electronically Signed   By: Lowella Grip III M.D.   On: 05/03/2016 15:59   US Thoracentesis Asp Pleural Space W/img Guide  05/04/2016  INDICATION: Symptomatic right sided pleural effusion EXAM: US THORACENTESIS ASP PLEURAL SPACE W/IMG GUIDE COMPARISON:  None. MEDICATIONS: 10 cc 1% lidocaine COMPLICATIONS: None immediate. TECHNIQUE: Informed written consent was obtained from the patient after a discussion of the risks, benefits and alternatives to treatment. A timeout was performed prior to the initiation of the procedure. Initial ultrasound scanning demonstrates a right pleural effusion. The lower chest was prepped and draped in the usual sterile fashion. 1% lidocaine was used for local anesthesia. Under direct ultrasound guidance, a 19 gauge, 7-cm, Yueh catheter was introduced. An ultrasound image was saved for documentation purposes. The thoracentesis was performed. The catheter was removed and a dressing was applied. The patient tolerated the procedure well without immediate post procedural complication. The patient was escorted to have an upright chest radiograph. FINDINGS: A total of approximately 320 cc of yellow fluid was removed. Requested samples were sent to the laboratory. IMPRESSION: Successful ultrasound-guided right sided  thoracentesis yielding 320 cc of pleural fluid. Read by:  Lavonia Drafts [facility] Electronically Signed   By: Marybelle Killings M.D.   On: 05/03/2016 15:58        Scheduled Meds: . acetaminophen  1,000 mg Oral Once  . antiseptic oral rinse  7 mL Mouth Rinse QID  . budesonide (PULMICORT) nebulizer solution  0.5 mg Nebulization BID  . chlorhexidine gluconate (SAGE KIT)  15 mL Mouth Rinse BID  . collagenase   Topical Daily  . enoxaparin (LOVENOX) injection  100 mg Subcutaneous Q12H  . famotidine  20 mg Per Tube BID  . guaiFENesin  10 mL Oral Q6H  . insulin aspart  0-15 Units Subcutaneous Q4H  . ipratropium-albuterol  3 mL Nebulization Q4H  . LORazepam  0.5 mg Intravenous BID  . magnesium sulfate 1 - 4 g bolus IVPB  1 g Intravenous Once  . meropenem (MERREM) IV  1 g Intravenous Q8H  . midodrine  10 mg Per Tube TID WC   Continuous Infusions: . sodium chloride 50 mL/hr at 05/04/16 0926     LOS: 4 days    Time spent: 40 minutes    Mitchelle Sultan, Geraldo Docker, MD [facility] Pager [phone]   If 7PM-7AM, please contact night-coverage [email or web address] Password [facility] 05/04/2016, 7:39 PM

## 2016-05-04 NOTE — Progress Notes (Signed)
Physical Therapy Wound Treatment Patient Details  Name: Scott Lawrence MRN: [id number] Date of Birth: 1941/04/22  Today's Date: 05/04/2016 Time: 1351-1440 Time Calculation (min): 49 min  Subjective  Subjective: Pt mouthing words appropriately Patient and Family Stated Goals: Heal wound.  Date of Onset:  (PTA) Prior Treatments: Pt's wife reports I&D at [facility] recently.   Pain Score:  Pt did not indicate pain throughout session but was obviously uncomfortable due to reportedly feeling hot and increased secretions.  Wound Assessment  Wound / Incision (Open or Dehisced) 05/01/16 Other (Comment) Ischial tuberosity Right (Active)  Dressing Type ABD;Barrier Film (skin prep);Gauze (Comment);Moist to dry 05/04/2016  2:00 PM  Dressing Changed Changed 05/04/2016  2:00 PM  Dressing Status Clean;Dry;Intact 05/04/2016  2:00 PM  Dressing Change Frequency Daily 05/04/2016  2:00 PM  Site / Wound Assessment Pink;Bleeding;Yellow 05/04/2016  2:00 PM  % Wound base Red or Granulating 75% 05/04/2016  2:00 PM  % Wound base Yellow 25% 05/04/2016  2:00 PM  % Wound base Black 0% 05/04/2016  2:00 PM  % Wound base Other (Comment) 0% 05/04/2016  2:00 PM  Peri-wound Assessment Intact 05/04/2016  2:00 PM  Wound Length (cm) 7 cm 05/01/2016 11:05 AM  Wound Width (cm) 8.2 cm 05/01/2016 11:05 AM  Wound Depth (cm) 4.2 cm 05/01/2016 11:05 AM  Undermining (cm) 2.5 cm at 2:00 05/01/2016 11:05 AM  Margins Unattached edges (unapproximated) 05/04/2016  2:00 PM  Closure None 05/04/2016  2:00 PM  Drainage Amount Minimal 05/04/2016  2:00 PM  Drainage Description Purulent 05/04/2016  2:00 PM  Treatment Debridement (Selective);Hydrotherapy (Pulse lavage);Packing (Saline gauze) 05/04/2016  2:00 PM   Santyl applied to wound bed prior to applying dressing.    Wound / Incision (Open or Dehisced) 05/01/16 Other (Comment) Ischial tuberosity Left (Active)  Dressing Type ABD;Barrier Film (skin prep);Gauze (Comment);Moist to dry 05/04/2016  2:00 PM  Dressing Changed Changed  05/04/2016  2:00 PM  Dressing Status Clean;Dry;Intact 05/04/2016  2:00 PM  Dressing Change Frequency Daily 05/04/2016  2:00 PM  Site / Wound Assessment Pink;Yellow;Black 05/04/2016  2:00 PM  % Wound base Red or Granulating 30% 05/04/2016  2:00 PM  % Wound base Yellow 30% 05/04/2016  2:00 PM  % Wound base Black 40% 05/04/2016  2:00 PM  % Wound base Other (Comment) 0% 05/04/2016  2:00 PM  Peri-wound Assessment Intact;Induration 05/04/2016  2:00 PM  Wound Length (cm) 4.8 cm 05/01/2016 11:05 AM  Wound Width (cm) 7.5 cm 05/01/2016 11:05 AM  Wound Depth (cm) 2 cm 05/01/2016 11:05 AM  Undermining (cm) 1.0 at 11:00 05/01/2016 11:05 AM  Margins Unattached edges (unapproximated) 05/04/2016  2:00 PM  Closure None 05/04/2016  2:00 PM  Drainage Amount Minimal 05/04/2016  2:00 PM  Drainage Description Purulent 05/04/2016  2:00 PM  Treatment Debridement (Selective);Hydrotherapy (Pulse lavage);Packing (Saline gauze) 05/04/2016  2:00 PM  Santyl applied to wound bed prior to applying dressing.   Hydrotherapy Pulsed lavage therapy - wound location: Bilateral ischiums Pulsed Lavage with Suction (psi): 12 psi Pulsed Lavage with Suction - Normal Saline Used: 1000 mL Pulsed Lavage Tip: Tip with splash shield Selective Debridement Selective Debridement - Location: Bilateral ischiums Selective Debridement - Tools Used: Forceps;Scissors Selective Debridement - Tissue Removed: Yellow and black necrotic tissue   Wound Assessment and Plan  Wound Therapy - Assess/Plan/Recommendations Wound Therapy - Clinical Statement: Pt incontinent of stool with soiling of all wounds. Notified nurse of significant secretions and probable need for trach care.  Wound Therapy - Functional Problem List:  Decreased tolerance for OOB/sitting in wheelchair.  Factors Delaying/Impairing Wound Healing: Altered sensation;Incontinence;Immobility;Multiple medical problems Hydrotherapy Plan: Debridement;Dressing change;Patient/family education;Pulsatile lavage with  suction Wound Therapy - Frequency: 6X / week Wound Therapy - Follow Up Recommendations: Skilled nursing facility Wound Plan: See above  Wound Therapy Goals- Improve the function of patient's integumentary system by progressing the wound(s) through the phases of wound healing (inflammation - proliferation - remodeling) by: Decrease Necrotic Tissue to: 25% Decrease Necrotic Tissue - Progress: Progressing toward goal Increase Granulation Tissue to: 75% Increase Granulation Tissue - Progress: Progressing toward goal Goals/treatment plan/discharge plan were made with and agreed upon by patient/family: Yes Time For Goal Achievement: 7 days Wound Therapy - Potential for Goals: Good  Goals will be updated until maximal potential achieved or discharge criteria met.  Discharge criteria: when goals achieved, discharge from hospital, MD decision/surgical intervention, no progress towards goals, refusal/missing three consecutive treatments without notification or medical reason.  GP     Rolinda Roan 05/04/2016, 2:49 PM   Rolinda Roan, PT, DPT Acute Rehabilitation Services Pager: [phone]

## 2016-05-04 NOTE — Consult Note (Signed)
Consultation Note Date: 05/04/2016   Patient Name: Scott Lawrence  DOB: 1941/02/01  MRN: [id number]  Age / Sex: 75 y.o., male  PCP: Verneita Griffes, MD Referring Physician: Allie Bossier, MD  Reason for Consultation: Establishing goals of care and Hospice Evaluation  HPI/Patient Profile: 75 y.o. male  with past medical history of CAD, STEMI, Pneumothorax and C6/C7 injury admitted on 04/30/2016 with hypotension and tachycardia. His C6/C7 injury was caused by a fall in March 2017, resulting in a paraplegic status with tracheostomy on vent support and PEG. At admission, he was found to be in septic shock from HCAP with cultures showing Pseudomonas. Recent Echo showing grade 2 diastolic dysfunction and pulmonary hypertension. Presently, he has increasing leukocytosis and had episodes of fever on IV antibiotics. He is s/p thoracentesis (358m) for pleural effusion and will be getting a repeat CT chest for suspected loculation. He also presents with multiple pressure ulcers, 2 of which is at the ischium and is contaminated by incontinence of stool.   Clinical Assessment and Goals of Care: Spoke with wife ([name], son (Marzetta Board and daughter in law (Colletta [place] over the phone. Family is understanding of patient's current health status and prognosis. They feel that patient does not have a good quality of life currently and his pneumonia would likely recur even if it is treated successfully this time.  They would like to proceed with CT chest, but would decline a more invasive procedure to treat if loculation is confirmed. They also stated that patient is very anxious and worries easily with room changes and similar events. The patient is intermittently confused.  Family does not wish to increase his anxiety with difficult conversations at this point.    Patient has a  previously witnessed Living Will that indicates he does not want  prolonged life support. Given that he is unlikely to walk again and would not be able to survive without vent support, family believes he does not want to continue with his current quality of life.  Family is considering comfort care/hospice, although due to patient being on vent support, will more likely have a hospital death with terminal wean. They plan to discuss with the rest of the family members to decide code status and goals of care. [place] will  have a phone conversation with wife and additional family members 7/5 evening.   PMT discussed comfort medications with the family.  Specifically morphine for dyspnea and ativan for anxiety.    HCPOA: Wife ([name]     SUMMARY OF RECOMMENDATIONS   - Will talk with family members to establish code status 7/5 - Will talk with family members to finalize goal of care 7/5 - Will add ativan scheduled in addition to PRN for anxiety - Add Morphine PRN for SOB/difficulty breathing  Code Status/Advance Care Planning:  Full code    Symptom Management:   Scheduled & PRN Ativan for anxiety  Morphine PRN for SOB/difficulty breathing    Psycho-social/Spiritual:   Additional  Recommendations: Caregiving  Support/Resources and Compassionate Wean Education  Prognosis:   Unable to determine.  If family choose full comfort then we anticipate a hospital death this admission.  Discharge Planning: To Be Determined      Primary Diagnoses: Present on Admission:  . HCAP (healthcare-associated pneumonia) . Chronic atrial fibrillation ([place]) . Compression fracture of C-spine ([place]) . Pleural effusion, right . Spinal fracture of T1 vertebra ([place]) . Protein-calorie malnutrition ([place]) . Acute on chronic respiratory failure with hypoxia ([place]) . Sepsis, unspecified organism ([place]) . Chronic hypotension . Pleural effusion . Ventilator dependent ([place]) . Hypokalemia . CAD in native artery . 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 The following aspects are pertinent.  Past Medical History  Diagnosis Date  . Coronary artery disease   . STEMI (ST elevation myocardial infarction) ([place])   . Pneumothorax   . Hypotension   . Pneumonia    Social History   Social History  . Marital Status: Married    Spouse Name: N/A  . Number of Children: N/A  . Years of Education: N/A   Social History Main Topics  . Smoking status: Never Smoker   . Smokeless tobacco: None  . Alcohol Use: No  . Drug Use: No  . Sexual Activity: Not Asked   Other Topics Concern  . None   Social History Narrative   No family history on file. Scheduled Meds: . acetaminophen  1,000 mg Oral Once  . antiseptic oral rinse  7 mL Mouth Rinse QID  . budesonide (PULMICORT) nebulizer solution  0.5 mg Nebulization BID  . chlorhexidine gluconate (SAGE KIT)  15 mL Mouth Rinse BID  . collagenase   Topical Daily  . enoxaparin (LOVENOX) injection  100 mg Subcutaneous Q12H  . famotidine  20 mg Per Tube BID  . guaiFENesin  10 mL Oral Q6H  . insulin aspart  0-15 Units Subcutaneous Q4H  . ipratropium-albuterol  3 mL Nebulization Q4H  . LORazepam  0.5 mg Intravenous BID  . meropenem (MERREM) IV  1 g Intravenous Q8H  . midodrine  10 mg Per Tube TID WC   Continuous Infusions: . sodium chloride 50 mL/hr at 05/04/16 0926   PRN Meds:.acetaminophen (TYLENOL) oral liquid 160 mg/5 mL, levalbuterol, LORazepam, morphine injection, ondansetron (ZOFRAN) IV, silver nitrate applicators   No Known Allergies Review of Systems: Patient is unable to provide  Physical Exam Constitutional: 75 year old male with tracheostomy, laying in bed, in no acute distress Vital Signs: BP 137/66 mmHg  Pulse 100  Temp(Src) 100 F (37.8 C) (Oral)  Resp 24  Ht 6' 1"  (1.854 m)  Wt 98.3 kg (216 lb 11.4 oz)  BMI 28.60 kg/m2  SpO2 93% Pain Assessment:  No/denies pain POSS *See Group Information*: S-Acceptable,Sleep, easy to arouse Pain Score: 0-No pain Pulm: Coarse breath sounds bilaterally with crackles/rhonchi. Respiratory dependent on vent Cardiac: Hard to hear due to loud coarse breath sounds. Normal S1 S2, No m/r/g Abdomen: Nondistended, nontender. + bowel sounds. PEG tube in place, dressing clean, dry and intact.  Extremities: No lower extremity edema bilaterally. Patient is paraplegic. Able to move upper ext.  SpO2: SpO2: 93 % O2 Device:SpO2: 93 % O2 Flow Rate: .   IO: Intake/output summary:   Intake/Output Summary (Last 24 hours) at 05/04/16 1538 Last data filed at 05/04/16 0700  Gross per 24 hour  Intake    960 ml  Output   1160  ml  Net   -200 ml    LBM: Last BM Date: 05/04/16 Baseline Weight: Weight: 81.647 kg (180 lb) Most recent weight: Weight: 98.3 kg (216 lb 11.4 oz)     Palliative Assessment/Data:   Flowsheet Rows        Most Recent Value   Intake Tab    Referral Department  Hospitalist   Unit at Time of Referral  Intermediate Care Unit   Palliative Care Primary Diagnosis  Other (Comment)   Date Notified  05/03/16   Palliative Care Type  New Palliative care   Reason for referral  Clarify Goals of Care   Date of Admission  04/12/2016   Date first seen by Palliative Care  05/04/16   # of days Palliative referral response time  1 Day(s)   # of days IP prior to Palliative referral  4   Clinical Assessment    Palliative Performance Scale Score  20%   Pain Min Last 24 hours  Not able to report   Dyspnea Max Last 24 Hours  6   Dyspnea Min Last 24 hours  2   Anxiety Max Last 24 Hours  6   Anxiety Min Last 24 Hours  2   Psychosocial & Spiritual Assessment    Palliative Care Outcomes    Patient/Family meeting held?  Yes   Who was at the meeting?  wife, daughter and 1 son on the phone (as they are 3 hours away)   Palliative Care Outcomes  Improved pain interventions, Improved non-pain symptom therapy, Counseled  regarding hospice      Time In: 1:30 Time Out: 3:00 Time Total: 90 min. Greater than 50%  of this time was spent counseling and coordinating care related to the above assessment and plan.  Signed by: Cathlean Sauer, PA-S Imogene Burn, PA-C Palliative Medicine Pager: [phone]   Please contact Palliative Medicine Team phone at [phone] for questions and concerns.  For individual provider: See Shea Evans

## 2016-05-05 DIAGNOSIS — J90 Pleural effusion, not elsewhere classified: Secondary | ICD-10-CM

## 2016-05-05 LAB — GLUCOSE, CAPILLARY
GLUCOSE-CAPILLARY: 82 mg/dL (ref 65–99)
GLUCOSE-CAPILLARY: 86 mg/dL (ref 65–99)
GLUCOSE-CAPILLARY: 88 mg/dL (ref 65–99)
GLUCOSE-CAPILLARY: 97 mg/dL (ref 65–99)
Glucose-Capillary: 97 mg/dL (ref 65–99)
Glucose-Capillary: 93 mg/dL (ref 65–99)

## 2016-05-05 LAB — PATHOLOGIST SMEAR REVIEW: PATH REVIEW: REACTIVE

## 2016-05-05 LAB — PH, BODY FLUID: pH, Body Fluid: 8.1

## 2016-05-05 MED ORDER — GLYCOPYRROLATE 0.2 MG/ML IJ SOLN
0.2000 mg | INTRAMUSCULAR | Status: DC | PRN
  Administered 2016-05-06 – 2016-05-09 (×3): 0.2 mg via INTRAVENOUS
  Filled 2016-05-05 (×4): qty 1

## 2016-05-05 MED ORDER — GLYCOPYRROLATE 1 MG PO TABS
1.0000 mg | ORAL_TABLET | ORAL | Status: DC | PRN
  Administered 2016-05-05: 1 mg via ORAL
  Filled 2016-05-05 (×2): qty 1

## 2016-05-05 MED ORDER — HALOPERIDOL LACTATE 2 MG/ML PO CONC
0.5000 mg | ORAL | Status: DC | PRN
  Filled 2016-05-05 (×2): qty 0.3

## 2016-05-05 MED ORDER — IPRATROPIUM-ALBUTEROL 0.5-2.5 (3) MG/3ML IN SOLN
3.0000 mL | Freq: Four times a day (QID) | RESPIRATORY_TRACT | Status: DC
  Administered 2016-05-05 – 2016-05-08 (×12): 3 mL via RESPIRATORY_TRACT
  Filled 2016-05-05 (×11): qty 3

## 2016-05-05 MED ORDER — GLYCOPYRROLATE 0.2 MG/ML IJ SOLN
0.2000 mg | INTRAMUSCULAR | Status: DC | PRN
  Filled 2016-05-05: qty 1

## 2016-05-05 MED ORDER — MORPHINE SULFATE (PF) 2 MG/ML IV SOLN
1.0000 mg | INTRAVENOUS | Status: DC | PRN
  Administered 2016-05-05 – 2016-05-08 (×5): 2 mg via INTRAVENOUS
  Filled 2016-05-05 (×5): qty 1

## 2016-05-05 MED ORDER — POLYVINYL ALCOHOL 1.4 % OP SOLN
1.0000 [drp] | Freq: Four times a day (QID) | OPHTHALMIC | Status: DC | PRN
  Administered 2016-05-05 – 2016-05-06 (×2): 1 [drp] via OPHTHALMIC
  Filled 2016-05-05: qty 15

## 2016-05-05 MED ORDER — HALOPERIDOL LACTATE 5 MG/ML IJ SOLN: 0.5000 mg | INTRAMUSCULAR | Status: DC | PRN

## 2016-05-05 MED ORDER — MIDODRINE HCL 5 MG PO TABS
5.0000 mg | ORAL_TABLET | Freq: Three times a day (TID) | ORAL | Status: DC
  Administered 2016-05-05 – 2016-05-07 (×4): 5 mg
  Filled 2016-05-05 (×4): qty 1

## 2016-05-05 MED ORDER — HALOPERIDOL 0.5 MG PO TABS
0.5000 mg | ORAL_TABLET | ORAL | Status: DC | PRN
  Filled 2016-05-05: qty 1

## 2016-05-05 MED ORDER — SODIUM CHLORIDE 0.9 % IV SOLN: Freq: Once | INTRAVENOUS | Status: DC

## 2016-05-05 NOTE — Care Management Note (Signed)
Case Management Note  Patient Details  Name: Melanie CrazierScott H Giangregorio MRN: [id number] Date of Birth: 1941-04-19  Subjective/Objective:   [facility] assistant director - both declined pt transfer for similar reason - if family should change their minds for comfort care and refuse extubation, facilities would then be responsible for finding vent SNF bed               In-House Referral:  Hospice / Palliative Care, Clinical Social Work  Discharge planning Services  CM Consult  Status of Service:  In process, will continue to follow  Magdalene RiverMayo, Ronin Crager T, RN 05/05/2016, 9:55 AM

## 2016-05-05 NOTE — Progress Notes (Signed)
With Speech Therapy present, PMV trail was attempted.  Airway and mouth was suctioned, cuff slowly deflated and peep decreased to 0.  PMV was placed on pt:  PRVC 500, rate 18, fio2 40%, peep 0.  Pt was able to speak, but pt did not appear to tolerate this as he became very anxious and increased WOB noted.  Changed vent to PSV 30-35 psv, peep to 0, 40%, Insp. Cycle adjusted from 60-70%.   Pt was able to speak, but pt did not appear to tolerate this as he became very anxious and increased WOB noted.  Attempted PMV again and adjusted trigger to -2, this appeared to have a slight imporvement with speaking and less triggering of the vent, but pt did not appear to tolerate this as he became very anxious and increased WOB again noted, and desat to 85%.  Pt was placed back on full vent support (origional settings), unit RT aware.  Sat 98%, and no distress noted on full vent support.

## 2016-05-05 NOTE — Progress Notes (Signed)
Daily Progress Note   Patient Name: Scott Lawrence       Date: 05/05/2016 DOB: 22-Jan-1941  Age: 75 y.o. MRN#: [id number] Attending Physician: Cherene Altes, MD Primary Care Physician: Verneita Griffes, MD Admit Date: 04/19/2016  Reason for Consultation/Follow-up: Establishing goals of care  Subjective: Mr. Taddei had no complaints.  I asked him if he would be surprised if I told him his time may be short.  He shook his head no.  I told him I was speaking with his wife and he indicated that she knows what his wishes are.  PT came in and offered hydro therapy.  Mr. Mozingo politely refused.  He wanted to know his CT chest results.  He mouthed a lot more information that I could not understand.  I have asked speech to evaluate him for a vent valve that may enable him to communicate more clearly.  Length of Stay: 5  Current Medications: Scheduled Meds:  . acetaminophen  1,000 mg Oral Once  . antiseptic oral rinse  7 mL Mouth Rinse QID  . budesonide (PULMICORT) nebulizer solution  0.5 mg Nebulization BID  . chlorhexidine gluconate (SAGE KIT)  15 mL Mouth Rinse BID  . collagenase   Topical Daily  . enoxaparin (LOVENOX) injection  100 mg Subcutaneous Q12H  . famotidine  20 mg Per Tube BID  . guaiFENesin  10 mL Oral Q6H  . insulin aspart  0-15 Units Subcutaneous Q4H  . ipratropium-albuterol  3 mL Nebulization Q4H  . LORazepam  0.5 mg Intravenous BID  . meropenem (MERREM) IV  1 g Intravenous Q8H  . midodrine  10 mg Per Tube TID WC    Continuous Infusions: . sodium chloride 50 mL/hr at 05/04/16 0926    PRN Meds: acetaminophen (TYLENOL) oral liquid 160 mg/5 mL, levalbuterol, LORazepam, morphine injection, ondansetron (ZOFRAN) IV, silver nitrate applicators  Physical Exam    Wd,  chronically ill appearing man.      CV rrr difficult to hear over lung sounds Resp vent dependent, mildly increased work of breathing, unable to generate a productive cough.  Lungs sound very wet, crackles/rals. Abd soft, PEG in place.  Vital Signs: BP 121/64 mmHg  Pulse 106  Temp(Src) 98.6 F (37 C) (Oral)  Resp 32  Ht 6' 1"  (1.854 m)  Wt 96.163 kg (  212 lb)  BMI 27.98 kg/m2  SpO2 94% SpO2: SpO2: 94 % O2 Device: O2 Device: Ventilator O2 Flow Rate:    Intake/output summary:  Intake/Output Summary (Last 24 hours) at 05/05/16 1055 Last data filed at 05/05/16 1046  Gross per 24 hour  Intake 1055.83 ml  Output    725 ml  Net 330.83 ml   LBM: Last BM Date: 05/04/16 Baseline Weight: Weight: 81.647 kg (180 lb) Most recent weight: Weight: 96.163 kg (212 lb)       Palliative Assessment/Data:    Flowsheet Rows        Most Recent Value   Intake Tab    Referral Department  Hospitalist   Unit at Time of Referral  Intermediate Care Unit   Palliative Care Primary Diagnosis  Other (Comment)   Date Notified  05/03/16   Palliative Care Type  New Palliative care   Reason for referral  Clarify Goals of Care   Date of Admission  04/16/2016   Date first seen by Palliative Care  05/04/16   # of days Palliative referral response time  1 Day(s)   # of days IP prior to Palliative referral  4   Clinical Assessment    Palliative Performance Scale Score  20%   Pain Min Last 24 hours  Not able to report   Dyspnea Max Last 24 Hours  6   Dyspnea Min Last 24 hours  2   Anxiety Max Last 24 Hours  6   Anxiety Min Last 24 Hours  2   Psychosocial & Spiritual Assessment    Palliative Care Outcomes    Patient/Family meeting held?  Yes   Who was at the meeting?  wife, daughter and 1 son on the phone (as they are 3 hours away)   Palliative Care Outcomes  Improved pain interventions, Improved non-pain symptom therapy, Counseled regarding hospice      Patient Active Problem List   Diagnosis Date  Noted  . Pressure ulcer 05/04/2016  . Palliative care encounter   . Goals of care, counseling/discussion   . Encounter for hospice care discussion   . Anxiety state   . Dyspnea   . Loculated pleural effusion   . Pleural effusion on right   . Acute on chronic respiratory failure with hypoxia ([place])   . Sepsis, unspecified organism ([place])   . Chronic hypotension   . Pleural effusion   . Ventilator dependent ([place])   . Hypokalemia   . CAD in native artery   . Paraplegic spinal paralysis ([place])   . Chronic atrial fibrillation ([place])   . Compression fracture of C-spine ([place])   . Pleural effusion, right   . Spinal fracture of T1 vertebra ([place])   . Protein-calorie malnutrition ([place])   . HCAP (healthcare-associated pneumonia) 04/30/2016  . Atrial flutter ([place])   . Tachycardia   . Chronic respiratory failure with hypoxia ([place])   . H/O tracheostomy ([place])   . Protein calorie malnutrition ([place])   . Paraplegia [facility])     Palliative Care Assessment & Plan   Patient Profile:  75 y.o. male with past medical history of CAD, STEMI in 2004.  In March 2017 he fell and developed paraplegic below the chest from a cervical spine injury.  He has a Trach/PEG and is vent dependent.  He has multiple large wounds and poor albumin.  He has had recurrent pneumonia and presents from [facility] with HCAP as well as left and right pleural effusions.   Recommendations/Plan:  I spoke with his wife and family on 7/4 and will speak with them again at 5:30 pm on 7/5 by phone.  They are strongly considering terminal wean.   Family is in [place] (approx 3 hours away)  Scheduled ativan for anxiety  PRN Morphine for dyspnea  I have asked speech to evaluation for a vent valve if that may help him communicate more clearly at this critical time.  Will ask chaplain services to visit.   Code Status: Full  Prognosis:   Hours - Days  Discharge Planning:  Anticipated Hospital Death  Care plan was discussed  with wife / family.    Thank you for allowing the Palliative Medicine Team to assist in the care of this patient.   Time In: 10:00 Time Out: 10:35 Total Time 35 min Prolonged Time Billed no      Greater than 50%  of this time was spent counseling and coordinating care related to the above assessment and plan.  Imogene Burn, PA-C Palliative Medicine Pager: [phone]  Please contact Palliative Medicine Team phone at [phone] for questions and concerns.

## 2016-05-05 NOTE — Progress Notes (Signed)
PULMONARY / CRITICAL CARE MEDICINE   Name: Scott CrazierScott H Lawrence MRN: [id number] DOB: Dec 14, 1940    ADMISSION DATE:  December 03, 2015 CONSULTATION DATE:  04/30/16  REFERRING MD:  Rhunette CroftNanavati (EDP)  CHIEF COMPLAINT:  Hypotension  BRIEF 75 y/o male s/p C-spine injury 12/2015 sent to [facility] with vent, [facility]ach [facility] with septic shock from HCAP.    SUBJECTIVE:  Palliative care meeting> plan for withdrawal of care  VITAL SIGNS: BP 133/60 mmHg  Pulse 107  Temp(Src) 98.6 F (37 C) (Oral)  Resp 31  Ht 6\' 1"  (1.854 m)  Wt 212 lb (96.163 kg)  BMI 27.98 kg/m2  SpO2 95%  HEMODYNAMICS:    VENTILATOR SETTINGS: Vent Mode:  [-] PRVC FiO2 (%):  [40 %] 40 % Set Rate:  [18 bmp] 18 bmp Vt Set:  [500 mL] 500 mL PEEP:  [5 cmH20] 5 cmH20 Plateau Pressure:  [23 cmH20-29 cmH20] 29 cmH20  INTAKE / OUTPUT: I/O last 3 completed shifts: In: 1965.8 [I.V.:1505.8; Other:160; IV Piggyback:300] Out: 1285 [Urine:1285]   PHYSICAL EXAMINATION: General: Chronically ill appearing adult male, comfortable today HENT: trach in place, hard collar in place PULM: vent supported breaths, clear today CV: RRR, no mgr Belly: soft, nontender, non-distended MSK: diminished bulk/tone Neuro: conversant today, awake  LABS:  BMET  Recent Labs Lab 05/03/16 0654 05/04/16 0420 05/04/16 1124  NA 136 137 136  K 3.4* 2.9* 3.5  CL 104 104 105  CO2 26 24 24   BUN 7 8 8   CREATININE 0.33* 0.40* 0.43*  GLUCOSE 98 84 88    Electrolytes  Recent Labs Lab 04/30/16 0241  05/02/16 0341 05/03/16 0654 05/04/16 0420 05/04/16 1124  CALCIUM 7.5*  < > 7.7* 8.0* 7.8* 7.8*  MG 1.8  --  1.8 1.8  --  1.8  PHOS 3.2  --   --   --   --   --   < > = values in this interval not displayed.  CBC  Recent Labs Lab 05/02/16 0833 05/03/16 0654 05/04/16 0420  WBC 10.4 14.2* 17.7*  HGB 7.3* 7.1* 9.1*  HCT 24.1* 23.2* 29.5*  PLT 340 357 360    Coag's No results for input(s): APTT, INR in the last 168  hours.  Sepsis Markers  Recent Labs Lab 12/08/15 1840 12/08/15 2252 04/30/16 0241 05/01/16 0239 05/02/16 0341  LATICACIDVEN 1.61 1.60  --   --  1.0  PROCALCITON  --   --  0.18 0.12  --     ABG  Recent Labs Lab 04/30/16 0234 04/30/16 0516  PHART 7.472* 7.406  PCO2ART 44.0 50.1*  PO2ART 64.0* 99.0    Liver Enzymes  Recent Labs Lab 05/02/16 0341 05/03/16 0654 05/04/16 0420  AST 19 21 19   ALT 27 28 24   ALKPHOS 85 89 85  BILITOT 0.2* 0.8 1.0  ALBUMIN 1.4* 1.6* 1.5*    Cardiac Enzymes No results for input(s): TROPONINI, PROBNP in the last 168 hours.  Glucose  Recent Labs Lab 05/04/16 1208 05/04/16 1625 05/04/16 1953 05/04/16 2355 05/05/16 0431 05/05/16 0757  GLUCAP 91 99 100* 97 97 88    Imaging Ct Chest Wo Contrast  05/04/2016  CLINICAL DATA:  Cervical spine injury in March of 2017. [facility] 29th with septic shock. History of pneumothorax. EXAM: CT CHEST WITHOUT CONTRAST TECHNIQUE: Multidetector CT imaging of the chest was performed following the standard protocol without IV contrast. COMPARISON:  Chest CT dated 0February 01, 2017 FINDINGS: Mediastinum/Lymph Nodes: Tracheostomy appears adequately positioned  with tip above the level of the carina. Scattered atherosclerotic changes noted along the walls of the normal caliber thoracic aorta. Heart size is upper normal, stable. No pericardial effusion. Coronary artery calcifications and presumed stent again noted. Scattered small and moderate-sized lymph nodes noted throughout the mediastinum. Esophagus is unremarkable. Trachea and central bronchi are unremarkable. Lungs/Pleura: Again noted is a moderate-sized right pleural effusion. Adjacent dense consolidation is presumably compressive atelectasis. There is a new left pleural effusion which is small to moderate in size with adjacent atelectasis. Diffuse consolidations are seen throughout the upper lobes, and to a lesser degree in the lower lungs  bilaterally, majority of which has a ground-glass density. No pneumothorax seen. Upper abdomen: No acute findings. Percutaneous gastrostomy tube appears adequately positioned. Musculoskeletal: Healing fracture within the sternum. Fixation hardware incompletely imaged at the cervical thoracic junction. Stable compression deformity of the T7 vertebral body, of uncertain age. IMPRESSION: 1. Right pleural effusion, moderate in size, stable. Adjacent dense consolidation within the right lower lobe is stable and most likely compressive atelectasis. 2. New left pleural effusion, small to moderate in size, also with adjacent compressive atelectasis. 3. Diffuse consolidations throughout the remainder of the lungs, majority with a ground-glass density. Differential includes atypical pneumonias such as viral or fungal, interstitial pneumonias, edema related to volume overload/CHF, sequela of aspiration and ARDS. 4. Numerous small and moderate-sized lymph nodes within the mediastinum, most likely reactive in nature. 5. Fixation hardware incompletely imaged at the cervical thoracic junction. Stable mild compression deformity of the T7 vertebral body, again of uncertain age. These results will be called to the ordering clinician or representative by the Radiologist Assistant, and communication documented in the PACS or zVision Dashboard. Electronically Signed   By: Bary RichardStan  Maynard M.D.   On: 05/04/2016 16:46     STUDIES:  CXR 6/29 > right mid to lower consolidation  CTA chest 6/29 > negative for PE, mod right sided pleural effusion with underlying opacity. 7/4 CT chest > R pleural effusion, trace left pleural effusion, bilateral airspace disease  CULTURES: Blood 6/29 >> (-) Urine 6/29 >> (-) Sputum 6/29 >> PSA  ANTIBIOTICS: Vanc 6/29 >> 7/3 Zosyn 6/29 >>  SIGNIFICANT EVENTS: 6/29 > admitted with HCAP.  LINES/TUBES: Trach (March 2017) >> PEG (March 2017) >>  DISCUSSION: 75 y.o. M who is paraplegic following  C6 / C7 injury in March 2017 with resultant trach / PEG and currently resides at [facility] who has HCAP.  He has ongoing pulmonary toilette issues as of 7/3.  Family has decided to withdraw care as of 7/5.  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  I think that the bulk of his dyspnea comes from pneumonia and neuromuscular weakness/inability to cough with significant force. P:   Agree completely with terminal wean Would use morphine for dyspnea Would not put him through any more pleural drainage attempts  PCCM will sign off  > 35 minutes spent in chart review, reviewing images, and discussion with multiple providers (case manager, primary physician, palliative care)  Heber CarolinaBrent McQuaid, MD [facility] Pager: [phone] Cell: [phone] After 3pm or if  no response, call [phone]

## 2016-05-05 NOTE — Progress Notes (Signed)
   05/05/16 1400  Clinical Encounter Type  Visited With Family;Patient not available  Visit Type Initial  Referral From Palliative care team  Family at bedside; patient sleeping.  CH to follow-up after Palliative GOC meeting on 7/5.

## 2016-05-05 NOTE — Progress Notes (Signed)
PT Cancellation Note  Patient Details Name: Scott Lawrence MRN: [id number] DOB: 11/24/1940   Cancelled Treatment:    Reason Eval/Treat Not Completed: Pt declines hydrotherapy today. Scott DownsMarianne York, PA-C in with pt and reports goals of care meeting with pt and family this afternoon/evening. Will continue to follow and check back for established goals of care after family meeting.   Scott Lawrence, Scott Lawrence 05/05/2016, 9:34 AM   Scott Lawrence, PT, DPT Acute Rehabilitation Services Pager: [phone]

## 2016-05-05 NOTE — Evaluation (Signed)
Passy-Muir Speaking Valve - Evaluation Patient Details  Name: Scott CrazierScott H Lawrence MRN: [id number] Date of Birth: Oct 06, 1941  Today's Date: 05/05/2016 Time: 1320-1400 SLP Time Calculation (min) (ACUTE ONLY): 40 min  Past Medical History:  Past Medical History  Diagnosis Date  . Coronary artery disease   . STEMI (ST elevation myocardial infarction) (HCC)   . Pneumothorax   . Hypotension   . Pneumonia    Past Surgical History:  Past Surgical History  Procedure Laterality Date  . Cervical spine surgery     HPI:  75 y.o. male with past medical history of CAD, STEMI in 2004. In March 2017 he fell and developed paraplegic below the chest from a cervical spine injury. He has a Trach/PEG and is vent dependent. He has multiple large wounds and poor albumin. He has had recurrent pneumonia and presented from [facility] on 6/29 with HCAP as well as left and right pleural effusions.  Dx sepsis, chronic resp failure.  Attempted weaning several times at [facility], as many as 3-4 days off vent, then usually relapsing with infection or atelectasis/inability clear secretions.  Per brother, pt occasionally used inline PMV at [facility].  Inline PMV ordered 7/5 per Palliative Medicine.  Family working with Palliative Care and making decisions regarding goals.    Assessment / Plan / Recommendation Clinical Impression  Inline PMV assessment completed - pt with difficulty tolerating valve; barriers appeared to be related to anxiety.  RT present to manage ventilator adjustments and suction tracheally.  Pt with thick secretions; airway and oral cavity suctioned; cuff deflated slowly and PEEP decreased to 0.  Vent modifications made (see RT note).  Pt immediately achieved voice; speech was intelligible.  RR increased to mid-50s and pt voiced discomfort, asking for valve to be removed.  PMV removed, vent returned to original settings.  Pt allowed to rest for several minutes, then nodded yes when asked if he was amenable to  attempting valve use again.  RT again made adjustments to vent, PMV placed with trigger modifications to improve comfort .  Pt initially appeared more comfortable; he communicated needs verbally, then WOB increased, Sp02 decreased to 85, and pt asked again for valve to be removed.  Pt placed on full vent support per RT. After resting, pt was provided with alphabet board to help facilitate communication.  With board held in front of him, pt able to direct-select letters to spell basic needs.  (He attempts to articulate words at rapid pace, but it is difficult to discern his communications.) SLP will continue to follow in an effort to establish best method of communication for pt.  Will f/u next date with RT for inline PMV trials as well.     SLP Assessment  Patient needs continued Speech Lanaguage Pathology Services    Follow Up Recommendations       Frequency and Duration min 3x week  2 weeks    PMSV Trial PMSV was placed for:  (five minute trials at best) Able to redirect subglottic air through upper airway: Yes Able to Attain Phonation: Yes Voice Quality: Low vocal intensity Able to Expectorate Secretions: No attempts Breath Support for Phonation: Inadequate Intelligibility: Intelligible Respirations During Trial:  (ranged from 30s-50s) SpO2 During Trial:  (90s initially - dropped to 80s) Behavior: Alert;Anxious   Tracheostomy Tube       Vent Dependency  Vent Dependent: Yes Vent Mode: PRVC Set Rate: 18 bmp PEEP: 5 cmH20 FiO2 (%): 40 % Vt Set: 500 mL    Cuff  Deflation Trial  Scott Lawrence L. Scott Lawrence, [place] CCC/SLP Pager [phone]  Tolerated Cuff Deflation: Yes Length of Time for Cuff Deflation Trial: ten minutes Behavior: Alert;Anxious        Scott Lawrence, Scott Lawrence Scott Lawrence 05/05/2016, 2:48 PM

## 2016-05-05 NOTE — Consult Note (Signed)
Reason for Consult:pleural effusions Referring Physician: Dr. Annamaria Helling is an 75 y.o. male.  HPI: 75 yo paraplegic man admitted 6/29 with hypotension and diagnosed with HAP- pseudomonas. Noted on admission to have right lower lobe pneumonia and a right pleural effusion. He had a thoracentesis which drained 300 ml but still has a moderate right pleural effusion. Pleural fluid showed LDH of 100 and WBC= 1013 with 54% neutrophils, c/w an exudate. CXR showed a residual effusion and a repeat CT showed progression of bilateral air space disease, a persistent right effusion and a new left effusion.  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 He is has a tracheostomy, requires vent support and has a PEG for feeding. He has pressure ulcers, chronic hypotension and malnutrition. A recent Echo showing grade 2 diastolic dysfunction and pulmonary hypertension.   He is alert but unable to write and I am unable to lip read, so history obtained form chart.   Past Medical History  Diagnosis Date  . Coronary artery disease   . STEMI (ST elevation myocardial infarction) ([place])   . Pneumothorax   . Hypotension   . Pneumonia    Patient Active Problem List   Diagnosis Date Noted  . Pressure ulcer 05/04/2016  . Palliative care encounter   . Goals of care, counseling/discussion   . Encounter for hospice care discussion   . Anxiety state   . Dyspnea   . Loculated pleural effusion   . Pleural effusion on right   . Acute on chronic respiratory failure with hypoxia ([place])   . Sepsis, unspecified organism ([place])   . Chronic hypotension   . Pleural effusion   . Ventilator dependent ([place])   . Hypokalemia   . CAD in native artery   . 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 Chronic atrial fibrillation ([place])   . Compression fracture of C-spine ([place])   . Pleural effusion, right   . Spinal fracture of T1 vertebra ([place])   . Protein-calorie malnutrition ([place])   .  HCAP (healthcare-associated pneumonia) 04/30/2016  . Atrial flutter ([place])   . Tachycardia   . Chronic respiratory failure with hypoxia ([place])   . H/O tracheostomy ([place])   . Protein calorie malnutrition ([place])   . Paraplegia [facility])     Past Surgical History  Procedure Laterality Date  . Cervical spine surgery      No family history on file.  Social History:  reports that he has never smoked. He does not have any smokeless tobacco history on file. He reports that he does not drink alcohol or use illicit drugs.  Allergies: No Known Allergies  Medications:  Scheduled: . acetaminophen  1,000 mg Oral Once  . antiseptic oral rinse  7 mL Mouth Rinse QID  . budesonide (PULMICORT) nebulizer solution  0.5 mg Nebulization BID  . chlorhexidine gluconate (SAGE KIT)  15 mL Mouth Rinse BID  . collagenase   Topical Daily  . enoxaparin (LOVENOX) injection  100 mg Subcutaneous Q12H  . famotidine  20 mg Per Tube BID  . guaiFENesin  10 mL Oral Q6H  . insulin aspart  0-15 Units Subcutaneous Q4H  . ipratropium-albuterol  3 mL Nebulization Q4H  . LORazepam  0.5 mg Intravenous BID  . meropenem (MERREM) IV  1 g Intravenous Q8H  . midodrine  10 mg Per Tube TID WC    Results for orders placed or performed during the hospital encounter of 04/07/2016 (from  the past 48 hour(s))  Prepare RBC     Status: None   Collection Time: 05/03/16 10:43 AM  Result Value Ref Range   Order Confirmation ORDER PROCESSED BY BLOOD BANK   Glucose, capillary     Status: Abnormal   Collection Time: 05/03/16 12:00 PM  Result Value Ref Range   Glucose-Capillary 117 (H) 65 - 99 mg/dL  Lactate dehydrogenase (CSF, pleural or peritoneal fluid)     Status: Abnormal   Collection Time: 05/03/16  3:41 PM  Result Value Ref Range   LD, Fluid 100 (H) 3 - 23 U/L    Comment: (NOTE) Results should be evaluated in conjunction with serum values    Fluid Type-FLDH Pleural R   Protein, pleural or peritoneal fluid     Status: None    Collection Time: 05/03/16  3:41 PM  Result Value Ref Range   Total protein, fluid <3.0 g/dL    Comment: (NOTE) No normal range established for this test Results should be evaluated in conjunction with serum values    Fluid Type-FTP Pleural R   Body fluid cell count with differential     Status: Abnormal   Collection Time: 05/03/16  3:41 PM  Result Value Ref Range   Fluid Type-FCT Pleural R    Color, Fluid YELLOW YELLOW   Appearance, Fluid HAZY (A) CLEAR   WBC, Fluid 1013 (H) 0 - 1000 cu mm   Neutrophil Count, Fluid 54 (H) 0 - 25 %   Lymphs, Fluid 38 %   Monocyte-Macrophage-Serous Fluid 8 (L) 50 - 90 %   Eos, Fluid 0 %   Other Cells, Fluid 0 %  Glucose, pleural or peritoneal fluid     Status: None   Collection Time: 05/03/16  3:41 PM  Result Value Ref Range   Glucose, Fluid 107 mg/dL    Comment: (NOTE) No normal range established for this test Results should be evaluated in conjunction with serum values    Fluid Type-FGLU Pleural R   Glucose, capillary     Status: None   Collection Time: 05/03/16  6:49 PM  Result Value Ref Range   Glucose-Capillary 90 65 - 99 mg/dL  Glucose, capillary     Status: Abnormal   Collection Time: 05/03/16  7:15 PM  Result Value Ref Range   Glucose-Capillary 105 (H) 65 - 99 mg/dL  Glucose, capillary     Status: None   Collection Time: 05/03/16 11:28 PM  Result Value Ref Range   Glucose-Capillary 91 65 - 99 mg/dL  Glucose, capillary     Status: None   Collection Time: 05/04/16  4:08 AM  Result Value Ref Range   Glucose-Capillary 91 65 - 99 mg/dL   Comment 1 Notify RN   CBC     Status: Abnormal   Collection Time: 05/04/16  4:20 AM  Result Value Ref Range   WBC 17.7 (H) 4.0 - 10.5 K/uL   RBC 3.50 (L) 4.22 - 5.81 MIL/uL   Hemoglobin 9.1 (L) 13.0 - 17.0 g/dL    Comment: DELTA CHECK NOTED POST TRANSFUSION SPECIMEN REPEATED TO VERIFY    HCT 29.5 (L) 39.0 - 52.0 %   MCV 84.3 78.0 - 100.0 fL   MCH 26.0 26.0 - 34.0 pg   MCHC 30.8 30.0 - 36.0  g/dL   RDW 16.8 (H) 11.5 - 15.5 %   Platelets 360 150 - 400 K/uL  Comprehensive metabolic panel     Status: Abnormal   Collection Time: 05/04/16  4:20  AM  Result Value Ref Range   Sodium 137 135 - 145 mmol/L   Potassium 2.9 (L) 3.5 - 5.1 mmol/L   Chloride 104 101 - 111 mmol/L   CO2 24 22 - 32 mmol/L   Glucose, Bld 84 65 - 99 mg/dL   BUN 8 6 - 20 mg/dL   Creatinine, Ser 0.40 (L) 0.61 - 1.24 mg/dL   Calcium 7.8 (L) 8.9 - 10.3 mg/dL   Total Protein 6.0 (L) 6.5 - 8.1 g/dL   Albumin 1.5 (L) 3.5 - 5.0 g/dL   AST 19 15 - 41 U/L   ALT 24 17 - 63 U/L   Alkaline Phosphatase 85 38 - 126 U/L   Total Bilirubin 1.0 0.3 - 1.2 mg/dL   GFR calc non Af Amer >60 >60 mL/min   GFR calc Af Amer >60 >60 mL/min    Comment: (NOTE) The eGFR has been calculated using the CKD EPI equation. This calculation has not been validated in all clinical situations. eGFR's persistently <60 mL/min signify possible Chronic Kidney Disease.    Anion gap 9 5 - 15  Glucose, capillary     Status: None   Collection Time: 05/04/16  8:20 AM  Result Value Ref Range   Glucose-Capillary 94 65 - 99 mg/dL  Basic metabolic panel     Status: Abnormal   Collection Time: 05/04/16 11:24 AM  Result Value Ref Range   Sodium 136 135 - 145 mmol/L   Potassium 3.5 3.5 - 5.1 mmol/L   Chloride 105 101 - 111 mmol/L   CO2 24 22 - 32 mmol/L   Glucose, Bld 88 65 - 99 mg/dL   BUN 8 6 - 20 mg/dL   Creatinine, Ser 0.43 (L) 0.61 - 1.24 mg/dL   Calcium 7.8 (L) 8.9 - 10.3 mg/dL   GFR calc non Af Amer >60 >60 mL/min   GFR calc Af Amer >60 >60 mL/min    Comment: (NOTE) The eGFR has been calculated using the CKD EPI equation. This calculation has not been validated in all clinical situations. eGFR's persistently <60 mL/min signify possible Chronic Kidney Disease.    Anion gap 7 5 - 15  Magnesium     Status: None   Collection Time: 05/04/16 11:24 AM  Result Value Ref Range   Magnesium 1.8 1.7 - 2.4 mg/dL  Glucose, capillary     Status:  None   Collection Time: 05/04/16 12:08 PM  Result Value Ref Range   Glucose-Capillary 91 65 - 99 mg/dL  Glucose, capillary     Status: None   Collection Time: 05/04/16  4:25 PM  Result Value Ref Range   Glucose-Capillary 99 65 - 99 mg/dL  Glucose, capillary     Status: Abnormal   Collection Time: 05/04/16  7:53 PM  Result Value Ref Range   Glucose-Capillary 100 (H) 65 - 99 mg/dL  Glucose, capillary     Status: None   Collection Time: 05/04/16 11:55 PM  Result Value Ref Range   Glucose-Capillary 97 65 - 99 mg/dL  Glucose, capillary     Status: None   Collection Time: 05/05/16  4:31 AM  Result Value Ref Range   Glucose-Capillary 97 65 - 99 mg/dL  Glucose, capillary     Status: None   Collection Time: 05/05/16  7:57 AM  Result Value Ref Range   Glucose-Capillary 88 65 - 99 mg/dL    Ct Chest Wo Contrast  05/04/2016  CLINICAL DATA:  Cervical spine injury in March of 2017.  Presented to [facility] on June 29th with septic shock. History of pneumothorax. EXAM: CT CHEST WITHOUT CONTRAST TECHNIQUE: Multidetector CT imaging of the chest was performed following the standard protocol without IV contrast. COMPARISON:  Chest CT dated 04/21/2016 FINDINGS: Mediastinum/Lymph Nodes: Tracheostomy appears adequately positioned with tip above the level of the carina. Scattered atherosclerotic changes noted along the walls of the normal caliber thoracic aorta. Heart size is upper normal, stable. No pericardial effusion. Coronary artery calcifications and presumed stent again noted. Scattered small and moderate-sized lymph nodes noted throughout the mediastinum. Esophagus is unremarkable. Trachea and central bronchi are unremarkable. Lungs/Pleura: Again noted is a moderate-sized right pleural effusion. Adjacent dense consolidation is presumably compressive atelectasis. There is a new left pleural effusion which is small to moderate in size with adjacent atelectasis. Diffuse consolidations are seen throughout  the upper lobes, and to a lesser degree in the lower lungs bilaterally, majority of which has a ground-glass density. No pneumothorax seen. Upper abdomen: No acute findings. Percutaneous gastrostomy tube appears adequately positioned. Musculoskeletal: Healing fracture within the sternum. Fixation hardware incompletely imaged at the cervical thoracic junction. Stable compression deformity of the T7 vertebral body, of uncertain age. IMPRESSION: 1. Right pleural effusion, moderate in size, stable. Adjacent dense consolidation within the right lower lobe is stable and most likely compressive atelectasis. 2. New left pleural effusion, small to moderate in size, also with adjacent compressive atelectasis. 3. Diffuse consolidations throughout the remainder of the lungs, majority with a ground-glass density. Differential includes atypical pneumonias such as viral or fungal, interstitial pneumonias, edema related to volume overload/CHF, sequela of aspiration and ARDS. 4. Numerous small and moderate-sized lymph nodes within the mediastinum, most likely reactive in nature. 5. Fixation hardware incompletely imaged at the cervical thoracic junction. Stable mild compression deformity of the T7 vertebral body, again of uncertain age. These results will be called to the ordering clinician or representative by the Radiologist Assistant, and communication documented in the PACS or zVision Dashboard. Electronically Signed   By: Franki Cabot M.D.   On: 05/04/2016 16:46   Dg Chest Port 1 View  05/04/2016  CLINICAL DATA:  Pneumonia EXAM: PORTABLE CHEST 1 VIEW COMPARISON:  05/03/2016 FINDINGS: Diffuse airspace disease again noted with moderate right pleural effusion. Suspect small left effusion. No real change since prior study. Mild cardiomegaly. Tracheostomy tube is unchanged. IMPRESSION: Stable diffuse bilateral airspace disease and effusions. Electronically Signed   By: Rolm Baptise M.D.   On: 05/04/2016 07:46   Dg Chest Port 1  View  05/03/2016  CLINICAL DATA:  Status post right-sided thoracentesis EXAM: PORTABLE CHEST 1 VIEW COMPARISON:  May 01, 2016 FINDINGS: No pneumothorax. Right pleural effusion is slightly smaller following thoracentesis. There is new patchy airspace disease throughout the left mid lower lung zones. Patchy airspace opacity is also noted in the right mid and lower lung zones, similar to recent study. Heart is borderline enlarged with pulmonary vascularity within normal limits. Tracheostomy catheter tip is 7.8 cm above the carina. There is postoperative change in the visualized cervical and upper thoracic spine regions. IMPRESSION: No pneumothorax. Airspace consolidation in both mid and lower lung zones, new on the left and essentially stable on the right. Stable cardiac silhouette. There is persistent right pleural effusion, although pleural effusion appears marginally smaller after thoracentesis. Electronically Signed   By: Lowella Grip III M.D.   On: 05/03/2016 15:59   US Thoracentesis Asp Pleural Space W/img Guide  05/04/2016  INDICATION: Symptomatic right sided pleural effusion EXAM: US  THORACENTESIS ASP PLEURAL SPACE W/IMG GUIDE COMPARISON:  None. MEDICATIONS: 10 cc 1% lidocaine COMPLICATIONS: None immediate. TECHNIQUE: Informed written consent was obtained from the patient after a discussion of the risks, benefits and alternatives to treatment. A timeout was performed prior to the initiation of the procedure. Initial ultrasound scanning demonstrates a right pleural effusion. The lower chest was prepped and draped in the usual sterile fashion. 1% lidocaine was used for local anesthesia. Under direct ultrasound guidance, a 19 gauge, 7-cm, Yueh catheter was introduced. An ultrasound image was saved for documentation purposes. The thoracentesis was performed. The catheter was removed and a dressing was applied. The patient tolerated the procedure well without immediate post procedural complication. The  patient was escorted to have an upright chest radiograph. FINDINGS: A total of approximately 320 cc of yellow fluid was removed. Requested samples were sent to the laboratory. IMPRESSION: Successful ultrasound-guided right sided thoracentesis yielding 320 cc of pleural fluid. Read by:  Lavonia Drafts [facility] Electronically Signed   By: Marybelle Killings M.D.   On: 05/03/2016 15:58    Review of Systems  Unable to perform ROS: other   Blood pressure 133/60, pulse 107, temperature 98.6 F (37 C), temperature source Oral, resp. rate 31, height 6' 1"  (1.854 m), weight 212 lb (96.163 kg), SpO2 95 %. Physical Exam  Vitals reviewed. Constitutional: He is oriented to person, place, and time. No distress.  Appears malnourished  HENT:  Head: Normocephalic and atraumatic.  Eyes: Conjunctivae and EOM are normal. No scleral icterus.  Neck:  In cervical collar  Cardiovascular: Normal rate, regular rhythm and normal heart sounds.   No murmur heard. Respiratory: No respiratory distress.  Tracheostomy in place, on ventilator, rhonchi bilaterally  GI: He exhibits no distension. There is no tenderness.  Neurological: He is alert and oriented to person, place, and time.  Moves arms, paralyzed below waist  Skin: Skin is warm and dry.    Assessment/Plan: 75 yo man paralyzed secondary to a C spine injury in March of this year. He is at [facility] for long term vent support. He was admitted with sepsis secondary to pseudomonas pneumonia and had a right para-pneumonic effusion. Thoracentesis showed an exudate. He now has developed a left sided pneumonia and effusion on that side as well.  He is a poor surgical candidate due to paralysis, cervical spine injury (I doubt placement of a double lumen tube is possible), malnutrition, contaminated pressure ulcers, and bilateral air space disease. I do not think he would do well with VATS. His family is not present currently, but from the chart apparently they do not want any  aggressive intervention. Hopefully they will be here sometime today.  An alternative that is still invasive, but less so than surgery, would be to place bilateral pigtail catheters and administer thrombolytics. I don't think it is likely to completely drain the effusions but might help to some degree.  Melrose Nakayama 05/05/2016, 8:54 AM

## 2016-05-05 NOTE — Progress Notes (Signed)
No charge note  I had an extended conversation this evening with the patient's wife, Ruth, and his son Scotty.  After much discussion and consideration they have opted to change to comfort meaWindell Mouldingsures only.    He is now DNR.  The family would like to allow out of town relatives to visit over the weekend and then move forward with a terminal wean on Monday.  Scott MouldingRuth asked if Mr. Fayrene FearingJames could be kept in [facility].  We also tentatively planned the wean for 2:00 pm on Monday.  I told Scott MouldingRuth I would check on these things with the Nursing staff and give her a firm answer tomorrow.    Algis DownsMarianne York, New JerseyPA-C Palliative Medicine Pager: [phone]

## 2016-05-05 NOTE — Progress Notes (Signed)
PROGRESS NOTE  [place] TEAM 1 - Stepdown/ICU TEAM  Scott Lawrence  FBP:[id number] DOB: 1940-11-10 DOA: 04/28/2016 PCP: Verneita Griffes, MD   Brief Narrative:  75 y.o. male with Hx CAD, STEMI 12/29/02, Hypotension, HLD, S/P fracture wrist 06/05/14, Pneumothorax,C6 / C7 injury in March 2017 with resulting paraplegia > trach / PEG (performed at [facility]) who presented to [facility] ED 6/29 from [facility] due to hypotension and tachycardia. SBP was in the 70's but typically runs in low 100's and pt is chronically on midodrine.  In the ED he remained mildly hypotensive with SBP in 90s. He was started on low dose levophed and SBP improved to around 110.CXR revealed right mid to lower consolidation and CTA showed mod right sided pleural effusion with underlying opacity.  Assessment & Plan:   Sepsis due to Pseudomonas and Acinetobacter HCAP -Has completed 5+ days of antibiotic therapy - discontinue antibiotics and follow  Pleural effusion -IR right thoracentesis completed with results consistent with exudate - fluid collection incompletely drained and follow-up imaging notes new fluid accumulation on left as well - family presently planning to transition to comfort focused care only - TCTS consulted by Dr. Sherral Hammers and report patient is not appropriate candidate for surgical intervention even if aggressive care desired  Chronic respiratory failure - s/p trach March 2017 - ventilator dependent -Per patient/wife Kndred had been attempting to wean patient; at times tolerating 3-4 days off vent, then usually relapsing with infection or atelectasis/inability clear secretions -While at [facility] the patient has made no progress whatsoever in regards to weaning and PCCM feels this is highly unlikely at present -The family reports that the patient has a living will and would not wish to be on ventilator support long-term -Presently the family is finalizing plans to transition to comfort focused care only  ?Chronic Parox  Afib  -CHA2DS2VASC score 2 - sinus tachycardia presently  -unclear chronicity of this problem - I find not mention of this in a review of records from [place] -full dose lovenox for now and follow tele -anticoag may not be indicated long term if we can not confirm this pt has chronic Afib   CAD/STEMI -Asymptomatic  Chronic Hypotension -SBP usually runs in low 100s -Stable at this time   Hypokalemia -replaced   Protein calorie malnutrition -Continue tube feeding until final goals of care established   Anemia  -In setting of coronary artery disease and hypotension goal is hemoglobin greater than 7.9 - hold on any further transfusions (though not presently indicated nonetheless) until goals of care finalized   Mild hyperglycemia  -A1c NOT c/w DM   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 -Aspen collar 3 months  -Dr. Sherral Hammers spoke with Dr. Kary Kos Neurosurgery and reviewed C-spine/T-spine x-ray films. Agrees that hardware looks good, HOWEVER since surgery was only ~3 months ago patient MUST remain in Aspen collar. Stated once we have patient's pneumonia under control obtain CT of the C-spine and he would be glad to evaluate if time for collar to come off.  DVT prophylaxis: lovenox  Code Status: Full  Family Communication: No family present at time of exam (live out of town) Disposition Plan: SDU while goals of care being finalized   Consultants:  PCCM  Palliative Care  Dr. Kary Kos Neurosurgery - phone    Procedures/Significant Events:  3/6 Ct Head W/o Contrast (at [facility] ): - frontal abnormality right inferior frontal lobe artifact  vs contusion  3/6 Ct Spine Cervical Wo Contrast(at [facility] ):-severe cervical spine injury at the C6/C7 disc space. -Grade 2/3 anterolisthesis in conjunction with bilateral dislocated, locked facets. -Associated fracture right inferior C6 facet,Severe cord compression as a result.  -Suspect subtle marginal avulsion  fracture along the anterior superior corner of the C7 vertebral body in relation to anterior longitudinal ligament. 3/7Ct Cta Chest W/o [facility] ): -Sternal fracture with associated chest wall and mediastinal hematoma.-Posterior pneumomediastinum closely associated with esophageal wall. -Centrally located focus of gas which appears to be within the esophageal wall,concerning for esophageal injury. - -Trace associated right pneumothorax at the superomedial aspect.  3/7 fusion posterior thoracic; C4-T3 POSTERIOR CERVICAL THORACIC FUSION BY DR. [facility] ) 3/7 EGD (at [facility] performed by [place], Maia Petties, 3/13(at [facility] ) Tracheostomy  performed by Dr Theora Gianotti., 6/29 CXR > right mid to lower consolidation  6/29 CTA chest > mod right sided pleural effusion with underlying opacity  7/1 C-spine x-ray clearing;- Abnormal alignment is seen at C6-7.  7/1 T-spine x-ray:-Mild compression deformities of T7 and T8-8 as well as mild depression of the superior endplate of T4 unchanged from the recent CT scan and age indeterminate. - Posterior fusion hardware from approximately C4-T3 intact. -7/2 transfuse 1 unit PRBC  Antimicrobials: Vanc 6/29 > 7/3 Zosyn 6/29 > 7/4 Merrem 7/4  Subjective: The patient remains on the ventilator.  He is awake and presently appears comfortable.  He nods his head appropriately to some questions and at other times attempts to communicate with mouthing that I'm not able to understand.  He denies acute pain.  Objective: Filed Vitals:   05/05/16 0806 05/05/16 0813 05/05/16 0900 05/05/16 1000  BP:   122/72 121/64  Pulse:   113 106  Temp:      TempSrc:      Resp:   34 32  Height:      Weight:      SpO2: 95% 95% 92% 94%    Intake/Output Summary (Last 24 hours) at 05/05/16 1100 Last data filed at 05/05/16 1046  Gross per 24 hour  Intake 1055.83 ml  Output    725 ml  Net  330.83 ml   Filed Weights   05/03/16 0405 05/04/16 0416 05/05/16 0437  Weight: 98.2 kg (216 lb 7.9 oz) 98.3 kg (216 lb 11.4 oz) 96.163 kg (212 lb)   Examination: General: No acute distress evident Lungs: Poor air movement bilateral bases  Cardiovascular: Regular rate and rhythm without murmur gallop or rub Abdomen: Nontender, nondistended, soft, bowel sounds positive, no rebound, no ascites, no appreciable mass Extremities: No significant cyanosis, or clubbing - trace edema bilateral lower extremities persists  CBC:  Recent Labs Lab 04/19/2016 1825 04/30/16 0241 05/01/16 0239 05/02/16 0833 05/03/16 0654 05/04/16 0420  WBC 15.7* 15.2* 10.2 10.4 14.2* 17.7*  NEUTROABS 12.8*  --   --   --   --   --   HGB 7.9* 8.2* 7.2* 7.3* 7.1* 9.1*  HCT 26.5* 27.3* 24.4* 24.1* 23.2* 29.5*  MCV 86.3 87.5 85.9 86.7 85.9 84.3  PLT 418* 429* 338 340 357 388   Basic Metabolic Panel:  Recent Labs Lab 04/30/16 0241 05/01/16 0239 05/02/16 0341 05/03/16 0654 05/04/16 0420 05/04/16 1124  NA 134* 137 137 136 137 136  K 3.4* 3.3* 3.0* 3.4* 2.9* 3.5  CL 100* 104 103 104 104 105  CO2 29 28 27 26 24 24   GLUCOSE 119* 110* 128* 98 84 88  BUN 19 15  11 7 8 8   CREATININE 0.36* <0.30* <0.30* 0.33* 0.40* 0.43*  CALCIUM 7.5* 7.9* 7.7* 8.0* 7.8* 7.8*  MG 1.8  --  1.8 1.8  --  1.8  PHOS 3.2  --   --   --   --   --    GFR: Estimated Creatinine Clearance: 100.5 mL/min (by C-G formula based on Cr of 0.43).   Liver Function Tests:  Recent Labs Lab 04/17/2016 1825 05/02/16 0341 05/03/16 0654 05/04/16 0420  AST 37 19 21 19   ALT 43 27 28 24   ALKPHOS 111 85 89 85  BILITOT 0.4 0.2* 0.8 1.0  PROT 6.5 5.6* 5.8* 6.0*  ALBUMIN 1.7* 1.4* 1.6* 1.5*   CBG:  Recent Labs Lab 05/04/16 1625 05/04/16 1953 05/04/16 2355 05/05/16 0431 05/05/16 0757  GLUCAP 99 100* 97 97 88    Recent Results (from the past 240 hour(s))  Blood Culture (routine x 2)     Status: None   Collection Time: 04/25/2016  6:20 PM    Result Value Ref Range Status   Specimen Description BLOOD LEFT HAND  Final   Special Requests BOTTLES DRAWN AEROBIC ONLY 5CC  Final   Culture NO GROWTH 5 DAYS  Final   Report Status 05/04/2016 FINAL  Final  Blood Culture (routine x 2)     Status: None   Collection Time: 04/02/2016  6:20 PM  Result Value Ref Range Status   Specimen Description BLOOD LEFT FOREARM  Final   Special Requests IN PEDIATRIC BOTTLE 3CC  Final   Culture NO GROWTH 5 DAYS  Final   Report Status 05/04/2016 FINAL  Final  Urine culture     Status: Abnormal   Collection Time: 04/04/2016  6:47 PM  Result Value Ref Range Status   Specimen Description URINE, CATHETERIZED  Final   Special Requests NONE  Final   Culture <10,000 COLONIES/mL INSIGNIFICANT GROWTH (A)  Final   Report Status 05/01/2016 FINAL  Final  MRSA PCR Screening     Status: None   Collection Time: 04/30/16  4:40 AM  Result Value Ref Range Status   MRSA by PCR NEGATIVE NEGATIVE Final    Comment:        The GeneXpert MRSA Assay (FDA approved for NASAL specimens only), is one component of a comprehensive MRSA colonization surveillance program. It is not intended to diagnose MRSA infection nor to guide or monitor treatment for MRSA infections.   Urine culture     Status: None   Collection Time: 04/30/16 11:27 AM  Result Value Ref Range Status   Specimen Description URINE, CATHETERIZED  Final   Special Requests NONE  Final   Culture NO GROWTH  Final   Report Status 05/01/2016 FINAL  Final  Culture, respiratory (NON-Expectorated)     Status: None (Preliminary result)   Collection Time: 05/01/16 10:17 AM  Result Value Ref Range Status   Specimen Description TRACHEAL ASPIRATE  Final   Special Requests NONE  Final   Gram Stain   Final    ABUNDANT WBC PRESENT, PREDOMINANTLY PMN NO SQUAMOUS EPITHELIAL CELLS SEEN FEW GRAM NEGATIVE RODS RARE GRAM NEGATIVE COCCI IN PAIRS    Culture   Final    ABUNDANT PSEUDOMONAS AERUGINOSA ABUNDANT ACINETOBACTER  CALCOACETICUS/BAUMANNII COMPLEX SUSCEPTIBILITIES TO FOLLOW    Report Status PENDING  Incomplete   Organism ID, Bacteria PSEUDOMONAS AERUGINOSA  Final      Susceptibility   Pseudomonas aeruginosa - MIC*    CEFTAZIDIME 16 INTERMEDIATE Intermediate  CIPROFLOXACIN 1 SENSITIVE Sensitive     GENTAMICIN 2 SENSITIVE Sensitive     IMIPENEM 2 SENSITIVE Sensitive     PIP/TAZO 32 SENSITIVE Sensitive     CEFEPIME 8 INTERMEDIATE Intermediate     * ABUNDANT PSEUDOMONAS AERUGINOSA     Scheduled Meds: . acetaminophen  1,000 mg Oral Once  . antiseptic oral rinse  7 mL Mouth Rinse QID  . budesonide (PULMICORT) nebulizer solution  0.5 mg Nebulization BID  . chlorhexidine gluconate (SAGE KIT)  15 mL Mouth Rinse BID  . collagenase   Topical Daily  . enoxaparin (LOVENOX) injection  100 mg Subcutaneous Q12H  . famotidine  20 mg Per Tube BID  . guaiFENesin  10 mL Oral Q6H  . insulin aspart  0-15 Units Subcutaneous Q4H  . ipratropium-albuterol  3 mL Nebulization Q4H  . LORazepam  0.5 mg Intravenous BID  . meropenem (MERREM) IV  1 g Intravenous Q8H  . midodrine  10 mg Per Tube TID WC     LOS: 5 days   Time spent: 25 minutes  Cherene Altes, MD [facility] Office  [phone] Pager - Text Page per Shea Evans as per below:  On-Call/Text Page:      [email or web address]      password [email or web address]  If 7PM-7AM, please contact night-coverage [email or web address] Password [email or web address] 05/05/2016, 11:00 AM

## 2016-05-06 DIAGNOSIS — S22010A Wedge compression fracture of first thoracic vertebra, initial encounter for closed fracture: Secondary | ICD-10-CM

## 2016-05-06 LAB — CULTURE, RESPIRATORY

## 2016-05-06 LAB — CULTURE, RESPIRATORY W GRAM STAIN

## 2016-05-06 LAB — GLUCOSE, CAPILLARY: GLUCOSE-CAPILLARY: 73 mg/dL (ref 65–99)

## 2016-05-06 MED ORDER — LORAZEPAM 2 MG/ML IJ SOLN
1.0000 mg | Freq: Two times a day (BID) | INTRAMUSCULAR | Status: DC
  Administered 2016-05-06 – 2016-05-09 (×7): 1 mg via INTRAVENOUS
  Filled 2016-05-06 (×7): qty 1

## 2016-05-06 NOTE — Progress Notes (Signed)
No charge note Discussed with Dr. Cram of Neurosurgery.  As patientWynetta Lawrence is Comfort Care it is ok to remove the aspen collar.  Would support is neck with towel rolls as tolerated.  Algis DownsMarianne York, New JerseyPA-C Palliative Medicine Pager: [phone]

## 2016-05-06 NOTE — Care Management Important Message (Signed)
Important Message  Patient Details  Name: Scott CrazierScott H Lawrence MRN: [id number] Date of Birth: 24-Jul-1941   Medicare Important Message Given:  Yes    Bernadette HoitShoffner, Gareth Fitzner Coleman 05/06/2016, 10:20 AM

## 2016-05-06 NOTE — Progress Notes (Signed)
05/06/2016- Respiratory care note- Pt continuing with full vent support.  Pt has a #6 cuffed shiley in place.  Pt has a prod cough of thick tan secretions.  Trach care supplies at bedside.  Cont to follow progress.

## 2016-05-06 NOTE — Progress Notes (Signed)
PT Cancellation Note  Patient Details Name: Scott Lawrence MRN: [id number] DOB: [birth year]   Cancelled Treatment:    Reason Eval/Treat Not Completed: Per chart review, pt and family have opted for comfort measures only at this time. Will d/c from hydrotherapy. If needs change, please reconsult.   Conni SlipperKirkman, Shabrea Weldin 05/06/2016, 7:24 AM   Conni SlipperLaura Duriel Deery, PT, DPT Acute Rehabilitation Services Pager: [phone]

## 2016-05-06 NOTE — Progress Notes (Signed)
Daily Progress Note   Patient Name: Scott Lawrence       Date: 05/06/2016 DOB: 1941-07-20  Age: 75 y.o. MRN#: [id number] Attending Physician: Allie Bossier, MD Primary Care Physician: Verneita Griffes, MD Admit Date: 04/21/2016  Reason for Consultation/Follow-up: Establishing goals of care  Subjective: Scott Lawrence denied pain, and said he slept ok.  He indicated that he would like his aspen collar removed. He asked for a blanket stating he was chilled.  I spoke with his brother [name] in the hallway regarding the terminal wean scheduled for Monday.  I spoke with Dr. Sherral Hammers and then called Dr. Saintclair Halsted of neurosurgery - who advised that we could remove the Aspen collar as Scott Lawrence is now full comfort care.   I spoke on the phone with [name] (Wife) and updated her.   Length of Stay: 6  Current Medications: Scheduled Meds:  . antiseptic oral rinse  7 mL Mouth Rinse QID  . budesonide (PULMICORT) nebulizer solution  0.5 mg Nebulization BID  . chlorhexidine gluconate (SAGE KIT)  15 mL Mouth Rinse BID  . collagenase   Topical Daily  . enoxaparin (LOVENOX) injection  100 mg Subcutaneous Q12H  . famotidine  20 mg Per Tube BID  . ipratropium-albuterol  3 mL Nebulization QID  . LORazepam  0.5 mg Intravenous BID  . midodrine  5 mg Per Tube TID WC    Continuous Infusions: . sodium chloride 10 mL/hr at 05/05/16 1159    PRN Meds: acetaminophen (TYLENOL) oral liquid 160 mg/5 mL, glycopyrrolate **OR** glycopyrrolate **OR** glycopyrrolate, haloperidol **OR** haloperidol **OR** haloperidol lactate, levalbuterol, LORazepam, morphine injection, ondansetron (ZOFRAN) IV, polyvinyl alcohol, silver nitrate applicators  Physical Exam    Wd, chronically ill appearing man.   He is hot to the touch   CV rrr  difficult to hear over lung sounds Resp vent dependent, mildly increased work of breathing, unable to generate a productive cough.  Lungs sound very wet, crackles/rals. Abd soft, PEG in place. Utilizing abdominal muscles to breath.  Vital Signs: BP 149/71 mmHg  Pulse 101  Temp(Src) 99.5 F (37.5 C) (Oral)  Resp 23  Ht 6' 1"  (1.854 m)  Wt 94.802 kg (209 lb)  BMI 27.58 kg/m2  SpO2 90% SpO2: SpO2: 90 % O2 Device: O2 Device: Ventilator O2 Flow Rate:  Intake/output summary:   Intake/Output Summary (Last 24 hours) at 05/06/16 1609 Last data filed at 05/06/16 1539  Gross per 24 hour  Intake 335.33 ml  Output   1550 ml  Net -1214.67 ml   LBM: Last BM Date: 05/04/16 Baseline Weight: Weight: 81.647 kg (180 lb) Most recent weight: Weight: 94.802 kg (209 lb)       Palliative Assessment/Data:    Flowsheet Rows        Most Recent Value   Intake Tab    Referral Department  Hospitalist   Unit at Time of Referral  Intermediate Care Unit   Palliative Care Primary Diagnosis  Other (Comment)   Date Notified  05/03/16   Palliative Care Type  New Palliative care   Reason for referral  Clarify Goals of Care   Date of Admission  04/01/2016   Date first seen by Palliative Care  05/04/16   # of days Palliative referral response time  1 Day(s)   # of days IP prior to Palliative referral  4   Clinical Assessment    Palliative Performance Scale Score  20%   Pain Min Last 24 hours  Not able to report   Dyspnea Max Last 24 Hours  6   Dyspnea Min Last 24 hours  2   Anxiety Max Last 24 Hours  6   Anxiety Min Last 24 Hours  2   Psychosocial & Spiritual Assessment    Palliative Care Outcomes    Patient/Family meeting held?  Yes   Who was at the meeting?  wife, daughter and 1 son on the phone (as they are 3 hours away)   Palliative Care Outcomes  Improved pain interventions, Improved non-pain symptom therapy, Counseled regarding hospice      Patient Active Problem List   Diagnosis Date  Noted  . Pressure ulcer 05/04/2016  . Palliative care encounter   . Goals of care, counseling/discussion   . Encounter for hospice care discussion   . Anxiety state   . Dyspnea   . Loculated pleural effusion   . Pleural effusion on right   . Acute on chronic respiratory failure with hypoxia ([place])   . Sepsis, unspecified organism ([place])   . Chronic hypotension   . Pleural effusion   . Ventilator dependent ([place])   . Hypokalemia   . CAD in native artery   . Paraplegic spinal paralysis ([place])   . Chronic atrial fibrillation ([place])   . Compression fracture of C-spine ([place])   . Pleural effusion, right   . Spinal fracture of T1 vertebra ([place])   . Protein-calorie malnutrition ([place])   . HCAP (healthcare-associated pneumonia) 04/30/2016  . Atrial flutter ([place])   . Tachycardia   . Chronic respiratory failure with hypoxia ([place])   . H/O tracheostomy ([place])   . Protein calorie malnutrition ([place])   . Paraplegia [facility])     Palliative Care Assessment & Plan   Patient Profile:  75 y.o. male with past medical history of CAD, STEMI in 2004.  In March 2017 he fell and developed paraplegic below the chest from a cervical spine injury.  He has a Trach/PEG and is vent dependent.  He has multiple large wounds and poor albumin.  He has had recurrent pneumonia and presents from [facility] with HCAP as well as left and right pleural effusions.   Recommendations/Plan:  DNR  Terminal wean planned for Monday at 2:00 pm.  This will allow out of town family to visit over the weekend.  Ativan scheduled and PRN  Morphine PRN.  Ok to remove aspen collar, please support his neck as needed.  Appreciate Chaplain Services   Code Status: Full  Prognosis:   Hours - Days  Discharge Planning:  Anticipated Hospital Death  Care plan was discussed with wife / family.    Thank you for allowing the Palliative Medicine Team to assist in the care of this patient.   Time In: 2:00 Time Out: 2:35 Total  Time 35 min  no      Greater than 50%  of this time was spent counseling and coordinating care related to the above assessment and plan.  Imogene Burn, PA-C Palliative Medicine Pager: [phone]  Please contact Palliative Medicine Team phone at [phone] for questions and concerns.

## 2016-05-06 NOTE — Progress Notes (Signed)
   05/06/16 1600  Clinical Encounter Type  Visited With Patient  Visit Type Follow-up;Social support  Referral From Family  Consult/Referral To Chaplain  Spiritual Encounters  Spiritual Needs Emotional  Stress Factors  Patient Stress Factors Exhausted  Family Stress Factors Health changes   Chaplain visited with patient.  He was alert and smiling.  Chaplain offered ministry of presence to patient.  Rosezella FloridaLisa M Vp Surgery Center Of AuburnNyabinghi 05/06/2016 4:17 PM

## 2016-05-06 NOTE — Progress Notes (Signed)
PROGRESS NOTE    Scott Lawrence  LPF:[id number] DOB: 1941/02/14 DOA: 04/05/2016 PCP: Verneita Griffes, MD   Brief Narrative:  75 y.o. male with PMHx CAD native artery, STEMI 12/29/02, Hypotension, HLD, S/P fracture wrist 06/05/14, , Pneumothorax,C6 / C7 injury in March 2017 with resulting paraplegic status and tracheostomy / PEG (performed at [facility]).   He presented to [facility] ED 6/29 from [facility] due to hypotension and tachycardia. SBP was in the 70's but typically runs in low 100's and pt is chronically on midodrine.  In ED, he remained mildly hypotensive (compared to his norm) with SBP in 90's. He was started on low dose levophed and SBP improved to around 110. CXR revealed right mid to lower consolidation and CTA showed mod right sided pleural effusion with underlying opacity.  PCCM was called for admission of HCAP.   Assessment & Plan:   Active Problems:   HCAP (healthcare-associated pneumonia)   Chronic atrial fibrillation (HCC)   Compression fracture of C-spine (HCC)   Pleural effusion, right   Spinal fracture of T1 vertebra (HCC)   Protein-calorie malnutrition (HCC)   Acute on chronic respiratory failure with hypoxia (HCC)   Sepsis, unspecified organism (HCC)   Chronic hypotension   Pleural effusion   Ventilator dependent (HCC)   Hypokalemia   CAD in native artery   Paraplegic spinal paralysis (HCC)   Pressure ulcer   Palliative care encounter   Goals of care, counseling/discussion   Encounter for hospice care discussion   Anxiety state   Dyspnea   Loculated pleural effusion   Pleural effusion on right  Sepsis unspecified organism/HCAP/.positive pseudomonas aeruginosa/GNR -Leukocytosis trending up changed to Meropenem  -Pleural fluid pending -Comfort care; planned terminal wean on Monday 7/10  Parapneumonic right Effusion/parapneumonic left effusion? -7/3 Right thoracentesis; only removed 386m -Post thoracentesis x-ray showed significant Right pleural fluid  remaining most likely loculated. Chest CT pending -Consulted Dr. HRoxan HockeyCardiothoracic surgeon; agrees patient would need VATS /chest tube/pigtail cath with thrombolytics. Very poor candidate  Chronic respiratory failure - s/p trach March 2017/ventilator dependent. -Per patient/wife [facility] has been attempting to wean patient; at times tolerating 3-4 days off vent, then usually relapsing with infection or atelectasis/inability clear secretions. -Continue full vent support. -Tracheal suction QID -DuoNeb QID -Mucinex DM BID  Chronic diastolic CHF -Unable to place patient on traditional CHF drug secondary to hypotension  Pulmonary hypertension -See chronic diastolic CHF  Chronic A.fib (CHA2DS2VASC score 2)/CAD/STEMI. -Not on anticoagulation per outpatient MAR  -Echocardiogram pending -Strict in and out since admission + 9.9 L -Daily weight Filed Weights   05/04/16 0416 05/05/16 0437 05/06/16 0446  Weight: 98.3 kg (216 lb 11.4 oz) 96.163 kg (212 lb) 94.802 kg (209 lb)    Chronic Hypotension(SBP usually runs in low 100's.) -Normal saline 10 ml/hr -Midodrin 10 mg TID -A.m. cortisol: 10.4 normal .   Hypokalemia -Potassium goal> 4 -Potassium solution 60 mEq  Hypomagnesemia -Magnesium goal> 2 -Magnesium IV 1 g  Protein calorie malnutrition. -NPO.  Anemia.  Transfuse for Hgb < 8.  Recent Labs Lab 04/30/16 0241 05/01/16 0239 05/02/16 0833 05/03/16 0654 05/04/16 0420  HGB 8.2* 7.2* 7.3* 7.1* 9.1*  -7/2 transfuse 1 unit PRBC  DM Type 2 Controlled with complication -7/2 hemoglobin A1c= 6.0 -Lipid panel; within ADA guidelines  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 -C-spine/T-spine x-ray pending. Patient still in ASampsoncollar 3 months will review x-ray and if appears normal contact neurosurgery for clearance to discontinue collar . -Spoke with Dr. GDominica Severin  [facility]  and reviewed films. Agrees that hardware looks good, HOWEVER since surgery was  only ~3 months ago patient MUST remain in [place] collar. Stated once we have patient's pneumonia under control obtain CT of the C-spine and he would be glad to evaluate if time for collar to come off.   Goals of care -7/4 Palliative care spoke with wife today, and unsure she wants to continue aggressive care. On 7/6 another phone conference with wife and children to make final decision will be conducted following CT of chest + cardiothoracic surgery recommendations have been completed. -7/5 per palliative care note:Comfort care; planned terminal wean on Monday 7/10    DVT prophylaxis: Heparin drip  Code Status: Full  Family Communication: None  Disposition Plan: Comfort care; planned terminal wean on Monday 7/10   Consultants:  [facility]  phone consult Dr. Kary Kos Neurosurgery Dr. Roxan Hockey Cardiothoracic surgeon   Procedures/Significant Events:  3/6 Ct Head W/o Contrast (at [facility] ): - frontal abnormality right inferior frontal lobe artifact  vs contusion  3/6 Ct Spine Cervical [facility] ):-severe cervical spine injury at the C6/C7 disc space. -Grade 2/3 anterolisthesis in conjunction with bilateral dislocated, locked facets. -Associated fracture right inferior C6 facet,Severe cord compression as a result.  -Suspect subtle marginal avulsion fracture along the anterior superior corner of the C7 vertebral body in relation to anterior longitudinal ligament. 3/7Ct Cta Chest W/o [facility] ): -Sternal fracture with associated chest wall and mediastinal hematoma.-Posterior pneumomediastinum closely associated with esophageal wall. -Centrally located focus of gas which appears to be within the esophageal wall,concerning for esophageal injury. - -Trace associated right pneumothorax at the superomedial aspect.  3/7 fusion posterior thoracic; C4-T3 POSTERIOR CERVICAL THORACIC FUSION BY DR. [facility] ) 3/7 EGD (at [facility] performed by [place], Maia Petties, 3/13(at [facility] ) Tracheostomy  performed by Dr Theora Gianotti., 6/29 CXR > right mid to lower consolidation  6/29 CTA chest > mod right sided pleural effusion with underlying opacity  7/1 C-spine x-ray clearing;- Abnormal alignment is seen at C6-7.  7/1 T-spine x-ray:-Mild compression deformities of T7 and T8-8 as well as mild depression of the superior endplate of T4 unchanged from the recent CT scan and age indeterminate. - Posterior fusion hardware from approximately C4-T3 intact. -7/2 transfuse 1 unit PRBC 7/3 Right thoracentesis; 320 ml aspirated 7/3 echocardiogram;- LVEF= 55% to 60%. --(grade 2 diastolic dysfunction). -- Pulmonary arteries: PA peak pressure: 65 mm Hg (S).  Cultures 6/29 blood left forearm/hand negative 6/29 Urine positive insignificant growth 7/1 tracheal aspirate positive pseudomonas aeruginosa/GNR 7/4 CT chest WO contrast:1. Right pleural effusion, moderate in size, stable. Adjacent dense consolidation within the right lower lobe is stable and most likely compressive atelectasis. 2. New left pleural effusion, small to moderate in size, also with adjacent compressive atelectasis. 3. Diffuse consolidations throughout the remainder of the lungs, majority with a ground-glass density. Differential includes atypical pneumonias such as viral or fungal, interstitial pneumonias, edema related to volume overload/CHF, sequela of aspiration and ARDS. 4. Numerous small and moderate-sized lymph nodes within the mediastinum, most likely reactive in nature. 5. Fixation hardware incompletely imaged at the cervical thoracic junction. Stable mild compression deformity of the T7 vertebral body, again of uncertain age.  Antimicrobials: Vanc 6/29 > Zosyn 6/29 >   Devices    LINES / TUBES:  Trach (March 2017) > PEG (March 2017) >    Continuous Infusions: . sodium  chloride 10 mL/hr at 05/05/16 1159       Subjective: 7/6   Comfort care; planned terminal wean on Monday 7/10. A/O 4     Objective: Filed Vitals:   05/06/16 0445 05/06/16 0446 05/06/16 0744 05/06/16 0749  BP:      Pulse: 97  95   Temp: 99.5 F (37.5 C)     TempSrc: Axillary     Resp: 28  30   Height:      Weight:  94.802 kg (209 lb)    SpO2: 93%  92% 93%    Intake/Output Summary (Last 24 hours) at 05/06/16 0829 Last data filed at 05/06/16 0636  Gross per 24 hour  Intake 385.33 ml  Output   1250 ml  Net -864.67 ml   Filed Weights   05/04/16 0416 05/05/16 0437 05/06/16 0446  Weight: 98.3 kg (216 lb 11.4 oz) 96.163 kg (212 lb) 94.802 kg (209 lb)    Examination:  General: A/O 4, answers All questions , acute on chronic respiratory distress Eyes: negative scleral hemorrhage, negative anisocoria, negative icterus ENT: Negative Runny nose, negative gingival bleeding, Neck:  Negative scars, masses, torticollis, lymphadenopathy, JVD,  #6 cuffed trach in place Lungs: bilateral upper lobes clear to auscultation, unable to appreciate breath sounds RLL, without wheezes or crackles Cardiovascular: Regular rate and rhythm without murmur gallop or rub normal S1 and S2 Abdomen: negative abdominal pain, nondistended, positive soft, bowel sounds, no rebound, no ascites, no appreciable mass, PEG tube in place negative sign of infection Extremities: No significant cyanosis, clubbing, or edema bilateral lower extremities Skin: Negative rashes, lesions, ulcers Psychiatric:  Negative depression, negative anxiety, negative fatigue, negative mania  Central nervous system:  Cranial nerves II through XII intact, tongue/uvula midline, bilateral upper extremity muscle strength 5/5, bilateral lower extremity muscle strength 0/5, negative sensation,  negative dysarthria, negative expressive aphasia, negative receptive aphasia.  .     Data Reviewed: Care during the described time interval was provided by me .  I have reviewed this  patient's available data, including medical history, events of note, physical examination, and all test results as part of my evaluation. I have personally reviewed and interpreted all radiology studies.  CBC:  Recent Labs Lab 04/03/2016 1825 04/30/16 0241 05/01/16 0239 05/02/16 0833 05/03/16 0654 05/04/16 0420  WBC 15.7* 15.2* 10.2 10.4 14.2* 17.7*  NEUTROABS 12.8*  --   --   --   --   --   HGB 7.9* 8.2* 7.2* 7.3* 7.1* 9.1*  HCT 26.5* 27.3* 24.4* 24.1* 23.2* 29.5*  MCV 86.3 87.5 85.9 86.7 85.9 84.3  PLT 418* 429* 338 340 357 676   Basic Metabolic Panel:  Recent Labs Lab 04/30/16 0241 05/01/16 0239 05/02/16 0341 05/03/16 0654 05/04/16 0420 05/04/16 1124  NA 134* 137 137 136 137 136  K 3.4* 3.3* 3.0* 3.4* 2.9* 3.5  CL 100* 104 103 104 104 105  CO2 _0 GLUCOSE 119* 110* 128* 98 84 88  BUN _1 CREATININE 0.36* <0.30* <0.30* 0.33* 0.40* 0.43*  CALCIUM 7.5* 7.9* 7.7* 8.0* 7.8* 7.8*  MG 1.8  --  1.8 1.8  --  1.8  PHOS 3.2  --   --   --   --   --    GFR: Estimated Creatinine Clearance: 92.9 mL/min (by C-G formula based on Cr of 0.43). Liver Function Tests:  Recent Labs Lab 04/01/2016 1825 05/02/16 0341 05/03/16 0654 05/04/16 1950  AST 37 _0 ALT 43 _1 ALKPHOS 111 85 89 85  BILITOT 0.4 0.2* 0.8 1.0  PROT 6.5 5.6* 5.8* 6.0*  ALBUMIN 1.7* 1.4* 1.6* 1.5*   No results for input(s): LIPASE, AMYLASE in the last 168 hours. No results for input(s): AMMONIA in the last 168 hours. Coagulation Profile: No results for input(s): INR, PROTIME in the last 168 hours. Cardiac Enzymes: No results for input(s): CKTOTAL, CKMB, CKMBINDEX, TROPONINI in the last 168 hours. BNP (last 3 results) No results for input(s): PROBNP in the last 8760 hours. HbA1C: No results for input(s): HGBA1C in the last 72 hours. CBG:  Recent Labs Lab 05/05/16 0431 05/05/16 0757 05/05/16 1109 05/05/16 1734 05/05/16 1948  GLUCAP 97 88 86 93 82   Lipid  Profile: No results for input(s): CHOL, HDL, LDLCALC, TRIG, CHOLHDL, LDLDIRECT in the last 72 hours. Thyroid Function Tests: No results for input(s): TSH, T4TOTAL, FREET4, T3FREE, THYROIDAB in the last 72 hours. Anemia Panel: No results for input(s): VITAMINB12, FOLATE, FERRITIN, TIBC, IRON, RETICCTPCT in the last 72 hours. Urine analysis:    Component Value Date/Time   COLORURINE AMBER* 04/09/2016 1840   APPEARANCEUR CLOUDY* 04/01/2016 1840   LABSPEC 1.029 04/06/2016 1840   PHURINE 7.0 04/06/2016 1840   GLUCOSEU NEGATIVE 04/07/2016 1840   HGBUR NEGATIVE 04/09/2016 1840   BILIRUBINUR NEGATIVE 04/16/2016 [place] NEGATIVE 04/12/2016 1840   PROTEINUR 30* 04/27/2016 1840   NITRITE NEGATIVE 04/09/2016 1840   LEUKOCYTESUR SMALL* 04/28/2016 1840   Sepsis Labs: _2 (procalcitonin:4,lacticidven:4)  ) Recent Results (from the past 240 hour(s))  Blood Culture (routine x 2)     Status: None   Collection Time: 04/14/2016  6:20 PM  Result Value Ref Range Status   Specimen Description BLOOD LEFT HAND  Final   Special Requests BOTTLES DRAWN AEROBIC ONLY 5CC  Final   Culture NO GROWTH 5 DAYS  Final   Report Status 05/04/2016 FINAL  Final  Blood Culture (routine x 2)     Status: None   Collection Time: 04/06/2016  6:20 PM  Result Value Ref Range Status   Specimen Description BLOOD LEFT FOREARM  Final   Special Requests IN PEDIATRIC BOTTLE 3CC  Final   Culture NO GROWTH 5 DAYS  Final   Report Status 05/04/2016 FINAL  Final  Urine culture     Status: Abnormal   Collection Time: 04/25/2016  6:47 PM  Result Value Ref Range Status   Specimen Description URINE, CATHETERIZED  Final   Special Requests NONE  Final   Culture <10,000 COLONIES/mL INSIGNIFICANT GROWTH (A)  Final   Report Status 05/01/2016 FINAL  Final  MRSA PCR Screening     Status: None   Collection Time: 04/30/16  4:40 AM  Result Value Ref Range Status   MRSA by PCR NEGATIVE NEGATIVE Final    Comment:        The  GeneXpert MRSA Assay (FDA approved for NASAL specimens only), is one component of a comprehensive MRSA colonization surveillance program. It is not intended to diagnose MRSA infection nor to guide or monitor treatment for MRSA infections.   Urine culture     Status: None   Collection Time: 04/30/16 11:27 AM  Result Value Ref Range Status   Specimen Description URINE, CATHETERIZED  Final   Special Requests NONE  Final   Culture NO GROWTH  Final   Report Status 05/01/2016 FINAL  Final  Culture, respiratory (NON-Expectorated)     Status: None (  Preliminary result)   Collection Time: 05/01/16 10:17 AM  Result Value Ref Range Status   Specimen Description TRACHEAL ASPIRATE  Final   Special Requests NONE  Final   Gram Stain   Final    ABUNDANT WBC PRESENT, PREDOMINANTLY PMN NO SQUAMOUS EPITHELIAL CELLS SEEN FEW GRAM NEGATIVE RODS RARE GRAM NEGATIVE COCCI IN PAIRS    Culture   Final    ABUNDANT PSEUDOMONAS AERUGINOSA ABUNDANT ACINETOBACTER CALCOACETICUS/BAUMANNII COMPLEX SUSCEPTIBILITIES TO FOLLOW    Report Status PENDING  Incomplete   Organism ID, Bacteria PSEUDOMONAS AERUGINOSA  Final      Susceptibility   Pseudomonas aeruginosa - MIC*    CEFTAZIDIME 16 INTERMEDIATE Intermediate     CIPROFLOXACIN 1 SENSITIVE Sensitive     GENTAMICIN 2 SENSITIVE Sensitive     IMIPENEM 2 SENSITIVE Sensitive     PIP/TAZO 32 SENSITIVE Sensitive     CEFEPIME 8 INTERMEDIATE Intermediate     * ABUNDANT PSEUDOMONAS AERUGINOSA         Radiology Studies: Ct Chest Wo Contrast  05/04/2016  CLINICAL DATA:  Cervical spine injury in March of 2017. Presented to [facility] on June 29th with septic shock. History of pneumothorax. EXAM: CT CHEST WITHOUT CONTRAST TECHNIQUE: Multidetector CT imaging of the chest was performed following the standard protocol without IV contrast. COMPARISON:  Chest CT dated 04/03/2016 FINDINGS: Mediastinum/Lymph Nodes: Tracheostomy appears adequately positioned with tip  above the level of the carina. Scattered atherosclerotic changes noted along the walls of the normal caliber thoracic aorta. Heart size is upper normal, stable. No pericardial effusion. Coronary artery calcifications and presumed stent again noted. Scattered small and moderate-sized lymph nodes noted throughout the mediastinum. Esophagus is unremarkable. Trachea and central bronchi are unremarkable. Lungs/Pleura: Again noted is a moderate-sized right pleural effusion. Adjacent dense consolidation is presumably compressive atelectasis. There is a new left pleural effusion which is small to moderate in size with adjacent atelectasis. Diffuse consolidations are seen throughout the upper lobes, and to a lesser degree in the lower lungs bilaterally, majority of which has a ground-glass density. No pneumothorax seen. Upper abdomen: No acute findings. Percutaneous gastrostomy tube appears adequately positioned. Musculoskeletal: Healing fracture within the sternum. Fixation hardware incompletely imaged at the cervical thoracic junction. Stable compression deformity of the T7 vertebral body, of uncertain age. IMPRESSION: 1. Right pleural effusion, moderate in size, stable. Adjacent dense consolidation within the right lower lobe is stable and most likely compressive atelectasis. 2. New left pleural effusion, small to moderate in size, also with adjacent compressive atelectasis. 3. Diffuse consolidations throughout the remainder of the lungs, majority with a ground-glass density. Differential includes atypical pneumonias such as viral or fungal, interstitial pneumonias, edema related to volume overload/CHF, sequela of aspiration and ARDS. 4. Numerous small and moderate-sized lymph nodes within the mediastinum, most likely reactive in nature. 5. Fixation hardware incompletely imaged at the cervical thoracic junction. Stable mild compression deformity of the T7 vertebral body, again of uncertain age. These results will be  called to the ordering clinician or representative by the Radiologist Assistant, and communication documented in the PACS or zVision Dashboard. Electronically Signed   By: Franki Cabot M.D.   On: 05/04/2016 16:46        Scheduled Meds: . antiseptic oral rinse  7 mL Mouth Rinse QID  . budesonide (PULMICORT) nebulizer solution  0.5 mg Nebulization BID  . chlorhexidine gluconate (SAGE KIT)  15 mL Mouth Rinse BID  . collagenase   Topical Daily  . enoxaparin (LOVENOX) injection  100 mg Subcutaneous Q12H  . famotidine  20 mg Per Tube BID  . guaiFENesin  10 mL Oral Q6H  . ipratropium-albuterol  3 mL Nebulization QID  . LORazepam  0.5 mg Intravenous BID  . midodrine  5 mg Per Tube TID WC   Continuous Infusions: . sodium chloride 10 mL/hr at 05/05/16 1159     LOS: 6 days    Time spent: 40 minutes    WOODS, Geraldo Docker, MD [facility] Pager [phone]   If 7PM-7AM, please contact night-coverage [email or web address] Password [email or web address] 05/06/2016, 8:29 AM

## 2016-05-06 NOTE — Progress Notes (Signed)
Nutrition Follow Up/Brief Note  Chart reviewed. Pt now transitioned to comfort care.  No further nutrition interventions warranted at this time.  Please re-consult as needed.  Maureen ChattersKatie Tamma Brigandi, RD, LDN Pager #: [phone] After-Hours Pager #: [phone]

## 2016-05-06 NOTE — Progress Notes (Signed)
   05/06/16 1000  Clinical Encounter Type  Visited With Patient and family together  Visit Type Follow-up;Social support  Referral From Nurse  Consult/Referral To Chaplain  Spiritual Encounters  Spiritual Needs Emotional  Stress Factors  Patient Stress Factors Exhausted;Health changes;Loss of control;Major life changes  Family Stress Factors Exhausted;Health changes;Major life changes   Chaplain visited with patient.  Chaplain also contacted wife, Scott Lawrence, [phone]. She wanted to make sure that Chaplain visited with her husband on a regular basis.  Advised wife that Chaplain would go ans spend time with patient.  Scott Lawrence, his younger brother was also visiting with him.  Hard to understand patient as he could only mouth words. Patient will be terminally weaned on Monday and wife requested that a Chaplain be present at 1400 hours. Chaplain offered ministry of presence.    Scott Lawrence 05/06/2016 10:51 AM

## 2016-05-06 NOTE — Progress Notes (Signed)
ANTICOAGULATION CONSULT NOTE - Follow Up Consult  Pharmacy Consult for Lovenox Indication: atrial fibrillation  No Known Allergies  Patient Measurements: Height: 6\' 1"  (185.4 cm) Weight: 209 lb (94.802 kg) IBW/kg (Calculated) : 79.9 Heparin Dosing Weight:  89.3 kg  Vital Signs: Temp: 98.7 F (37.1 C) (07/06 0800) Temp Source: Axillary (07/06 0800) BP: 133/86 mmHg (07/06 1100) Pulse Rate: 95 (07/06 1150)  Labs:  Recent Labs  05/04/16 0420 05/04/16 1124  HGB 9.1*  --   HCT 29.5*  --   PLT 360  --   CREATININE 0.40* 0.43*    Estimated Creatinine Clearance: 92.9 mL/min (by C-G formula based on Cr of 0.43).   Assessment:  75 y.o. male admitted on 04/28/2016 with sepsis from [facility]. Pt on anticoagulation for Afib- Lovenox per pharmacy consult.   Last H/H from 7/4 was stable. No bleeding reported. SCr 0.43 on 7/4.  Patient now comfort care without repeat labs.   Goal of Therapy:  Anti-Xa level 0.6-1 units/ml 4hrs after LMWH dose given Monitor platelets by anticoagulation protocol: Yes   Plan:  Continue Lovenox 100mg  SQ every 12 hours. Monitor renal function and signs/symptoms of bleeding.   *Discussed with Dr. Joseph ArtWoods to consider if therapy should be continued now that patient DNR with comfort measures and no lab monitoring-- continue for now, but he will consider.   Link SnufferJessica Elaysha Bevard, PharmD, BCPS Clinical Pharmacist [phone] 05/06/2016 1:10 PM

## 2016-05-07 NOTE — Progress Notes (Signed)
Patient's sister-in-law, Linna HoffSilve Bissonette, was here and would like to be called if something happens to the patient.  Patient's brother and wife left to get something to eat and she left me her phone number: [phone].

## 2016-05-07 NOTE — Progress Notes (Signed)
PROGRESS NOTE  [place] TEAM 1 - Stepdown/ICU TEAM  Scott Lawrence  WUJ:[id number] DOB: [birth year] DOA: 04/18/2016 PCP: Verneita Griffes, MD   Brief Narrative:  75 y.o. male with Hx CAD, STEMI 12/29/02, Hypotension, HLD, S/P fracture wrist 06/05/14, Pneumothorax,C6 / C7 injury in March 2017 with resulting paraplegia > trach / PEG (performed at [facility]) who presented to [facility] ED 6/29 from [facility] due to hypotension and tachycardia. SBP was in the 70's but typically runs in low 100's and pt is chronically on midodrine.  In the ED he remained mildly hypotensive with SBP in 90s. He was started on low dose levophed and SBP improved to around 110.CXR revealed right mid to lower consolidation and CTA showed mod right sided pleural effusion with underlying opacity.  Assessment & Plan:   Sepsis due to Pseudomonas and Acinetobacter HCAP -completed 5+ days of antibiotic therapy  Pleural effusion -IR right thoracentesis completed with results consistent with exudate - fluid collection incompletely drained and follow-up imaging notes new fluid accumulation on left as well - family has transitioned to comfort focused care only   Chronic respiratory failure - s/p trach March 2017 - ventilator dependent -Per patient/wife [facility] had been attempting to wean patient; at times tolerating 3-4 days off vent, then usually relapsing with infection or atelectasis/inability clear secretions -While at [facility] the patient has made no progress whatsoever in regards to weaning and PCCM feels this is highly unlikely  -The family reported that the patient has a living will and would not wish to be on ventilator support long-term -continue comfort focused care w/ plan to terminally wean Monday  ?Chronic Parox Afib  -CHA2DS2VASC score 2 - comfort focused care only at this time  -unclear chronicity of this problem - I find not mention of this in a review of records from [place]  CAD/STEMI  Chronic Hypotension   Protein  calorie malnutrition  Mild hyperglycemia  -A1c NOT c/w DM   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 -Aspen collar now off at pt request as focus is on comfort only at this time   DVT prophylaxis: comfort focused care  Code Status: NO CODE BLUE Family Communication: No family present at time of exam  Disposition Plan: plan for terminal wean on Monday   Consultants:  PCCM  Palliative Care  Dr. Kary Kos Neurosurgery - phone    Procedures/Significant Events:  3/6 Ct Head W/o Contrast (at [facility] ): - frontal abnormality right inferior frontal lobe artifact  vs contusion  3/6 Ct Spine Cervical [facility] ):-severe cervical spine injury at the C6/C7 disc space. -Grade 2/3 anterolisthesis in conjunction with bilateral dislocated, locked facets. -Associated fracture right inferior C6 facet,Severe cord compression as a result.  -Suspect subtle marginal avulsion fracture along the anterior superior corner of the C7 vertebral body in relation to anterior longitudinal ligament. 3/7Ct Cta Chest W/o [facility] ): -Sternal fracture with associated chest wall and mediastinal hematoma.-Posterior pneumomediastinum closely associated with esophageal wall. -Centrally located focus of gas which appears to be within the esophageal wall,concerning for esophageal injury. - -Trace associated right pneumothorax at the superomedial aspect.  3/7 fusion posterior thoracic; C4-T3 POSTERIOR CERVICAL THORACIC FUSION BY DR. [facility] ) 3/7 EGD (at [facility] )[place] performed by [place], Maia Petties, 3/13(at [facility] ) Tracheostomy  performed by Dr Daisy Blossom R., 6/29 CXR > right mid to lower consolidation  6/29 CTA chest > mod right sided pleural  effusion with underlying opacity  7/1 C-spine x-ray clearing;- Abnormal alignment is seen at C6-7.  7/1 T-spine x-ray:-Mild compression deformities of T7 and T8-8 as  well as mild depression of the superior endplate of T4 unchanged from the recent CT scan and age indeterminate. - Posterior fusion hardware from approximately C4-T3 intact. -7/2 transfuse 1 unit PRBC  Antimicrobials: Vanc 6/29 > 7/3 Zosyn 6/29 > 7/4 Merrem 7/4  Subjective: No evidence of discomfort at this time.  No family present at the time of my exam.    Objective: Filed Vitals:   05/07/16 0354 05/07/16 0733 05/07/16 0835 05/07/16 1138  BP: 150/72  129/71   Pulse: 110 99 108 93  Temp:   98.3 F (36.8 C)   TempSrc:   Oral   Resp: 28 28 28 31   Height:      Weight:      SpO2: 93% 87% 89% 89%    Intake/Output Summary (Last 24 hours) at 05/07/16 1202 Last data filed at 05/07/16 0500  Gross per 24 hour  Intake    110 ml  Output   1050 ml  Net   -940 ml   Filed Weights   05/04/16 0416 05/05/16 0437 05/06/16 0446  Weight: 98.3 kg (216 lb 11.4 oz) 96.163 kg (212 lb) 94.802 kg (209 lb)   Examination: General: No acute distress evident Lungs: Poor air movement bilateral bases   CBC:  Recent Labs Lab 05/01/16 0239 05/02/16 0833 05/03/16 0654 05/04/16 0420  WBC 10.2 10.4 14.2* 17.7*  HGB 7.2* 7.3* 7.1* 9.1*  HCT 24.4* 24.1* 23.2* 29.5*  MCV 85.9 86.7 85.9 84.3  PLT 338 340 357 388   Basic Metabolic Panel:  Recent Labs Lab 05/01/16 0239 05/02/16 0341 05/03/16 0654 05/04/16 0420 05/04/16 1124  NA 137 137 136 137 136  K 3.3* 3.0* 3.4* 2.9* 3.5  CL 104 103 104 104 105  CO2 28 27 26 24 24   GLUCOSE 110* 128* 98 84 88  BUN 15 11 7 8 8   CREATININE <0.30* <0.30* 0.33* 0.40* 0.43*  CALCIUM 7.9* 7.7* 8.0* 7.8* 7.8*  MG  --  1.8 1.8  --  1.8   GFR: Estimated Creatinine Clearance: 92.9 mL/min (by C-G formula based on Cr of 0.43).   Liver Function Tests:  Recent Labs Lab 05/02/16 0341 05/03/16 0654 05/04/16 0420  AST 19 21 19   ALT 27 28 24   ALKPHOS 85 89 85  BILITOT 0.2* 0.8 1.0  PROT 5.6* 5.8* 6.0*  ALBUMIN 1.4* 1.6* 1.5*   CBG:  Recent Labs Lab  05/05/16 0757 05/05/16 1109 05/05/16 1734 05/05/16 1948 05/06/16 1237  GLUCAP 88 86 93 82 73    Recent Results (from the past 240 hour(s))  Blood Culture (routine x 2)     Status: None   Collection Time: 04/02/2016  6:20 PM  Result Value Ref Range Status   Specimen Description BLOOD LEFT HAND  Final   Special Requests BOTTLES DRAWN AEROBIC ONLY 5CC  Final   Culture NO GROWTH 5 DAYS  Final   Report Status 05/04/2016 FINAL  Final  Blood Culture (routine x 2)     Status: None   Collection Time: 04/25/2016  6:20 PM  Result Value Ref Range Status   Specimen Description BLOOD LEFT FOREARM  Final   Special Requests IN PEDIATRIC BOTTLE 3CC  Final   Culture NO GROWTH 5 DAYS  Final   Report Status 05/04/2016 FINAL  Final  Urine culture     Status:  Abnormal   Collection Time: 04/20/2016  6:47 PM  Result Value Ref Range Status   Specimen Description URINE, CATHETERIZED  Final   Special Requests NONE  Final   Culture <10,000 COLONIES/mL INSIGNIFICANT GROWTH (A)  Final   Report Status 05/01/2016 FINAL  Final  MRSA PCR Screening     Status: None   Collection Time: 04/30/16  4:40 AM  Result Value Ref Range Status   MRSA by PCR NEGATIVE NEGATIVE Final    Comment:        The GeneXpert MRSA Assay (FDA approved for NASAL specimens only), is one component of a comprehensive MRSA colonization surveillance program. It is not intended to diagnose MRSA infection nor to guide or monitor treatment for MRSA infections.   Urine culture     Status: None   Collection Time: 04/30/16 11:27 AM  Result Value Ref Range Status   Specimen Description URINE, CATHETERIZED  Final   Special Requests NONE  Final   Culture NO GROWTH  Final   Report Status 05/01/2016 FINAL  Final  Culture, respiratory (NON-Expectorated)     Status: None   Collection Time: 05/01/16 10:17 AM  Result Value Ref Range Status   Specimen Description TRACHEAL ASPIRATE  Final   Special Requests NONE  Final   Gram Stain   Final     ABUNDANT WBC PRESENT, PREDOMINANTLY PMN NO SQUAMOUS EPITHELIAL CELLS SEEN FEW GRAM NEGATIVE RODS RARE GRAM NEGATIVE COCCI IN PAIRS    Culture   Final    ABUNDANT PSEUDOMONAS AERUGINOSA ABUNDANT ACINETOBACTER CALCOACETICUS/BAUMANNII COMPLEX MULTI-DRUG RESISTANT ORGANISM RESULT CALLED TO, READ BACK BY AND VERIFIED WITH: T HOOD 05/06/16 @ 1004 M VESTAL    Report Status 05/06/2016 FINAL  Final   Organism ID, Bacteria PSEUDOMONAS AERUGINOSA  Final   Organism ID, Bacteria ACINETOBACTER CALCOACETICUS/BAUMANNII COMPLEX  Final      Susceptibility   Pseudomonas aeruginosa - MIC*    CEFTAZIDIME 16 INTERMEDIATE Intermediate     CIPROFLOXACIN 1 SENSITIVE Sensitive     GENTAMICIN 2 SENSITIVE Sensitive     IMIPENEM 2 SENSITIVE Sensitive     PIP/TAZO 32 SENSITIVE Sensitive     CEFEPIME 8 INTERMEDIATE Intermediate     * ABUNDANT PSEUDOMONAS AERUGINOSA   Acinetobacter calcoaceticus/baumannii complex - MIC*    CEFTAZIDIME >=64 RESISTANT Resistant     CEFTRIAXONE 16 INTERMEDIATE Intermediate     CIPROFLOXACIN >=4 RESISTANT Resistant     GENTAMICIN >=16 RESISTANT Resistant     IMIPENEM >=16 RESISTANT Resistant     PIP/TAZO >=128 RESISTANT Resistant     TRIMETH/SULFA >=320 RESISTANT Resistant     CEFEPIME >=64 RESISTANT Resistant     AMPICILLIN/SULBACTAM 8 SENSITIVE Sensitive     * ABUNDANT ACINETOBACTER CALCOACETICUS/BAUMANNII COMPLEX     Scheduled Meds: . antiseptic oral rinse  7 mL Mouth Rinse QID  . budesonide (PULMICORT) nebulizer solution  0.5 mg Nebulization BID  . chlorhexidine gluconate (SAGE KIT)  15 mL Mouth Rinse BID  . collagenase   Topical Daily  . famotidine  20 mg Per Tube BID  . ipratropium-albuterol  3 mL Nebulization QID  . LORazepam  1 mg Intravenous BID  . midodrine  5 mg Per Tube TID WC     LOS: 7 days    Cherene Altes, MD [facility] Office  [phone] Pager - Text Page per Shea Evans as per below:  On-Call/Text Page:      [email or web address]      password  [email or web address]  If 7PM-7AM, please contact night-coverage  [email or web address] Password [email or web address] 05/07/2016, 12:02 PM

## 2016-05-07 NOTE — Progress Notes (Signed)
Daily Progress Note   Patient Name: Scott Lawrence       Date: 05/07/2016 DOB: 01-Dec-1940  Age: 75 y.o. MRN#: [id number] Attending Physician: Cherene Altes, MD Primary Care Physician: Verneita Griffes, MD Admit Date: 04/13/2016  Reason for Consultation/Follow-up: Establishing goals of care  Subjective: Scott Lawrence appears comfortable.  Denies pain.  Says he is sleeping ok,  BM ok, urinating Ok.  Brother Scott Lawrence and [name]'s girlfriend Scott Lawrence are in the room.  Scott Lawrence and I talked at length about family complexities.  It seems the patient's wife and son do not get along or communicate well with the patient's siblings.  This has lead to misunderstandings and hurt feelings.  The brothers do not seem to have a good grasp of have the patient's medical condition.  They are very emotional about the compassionate wean on Monday.  I assured Scott Lawrence and Scott Lawrence that the patient's son Scott Lawrence knows about the compassionate wean Monday and that the patient will not experience any suffering when it happens.  That seemed to ease their minds.  I called wife Scott Lawrence on the phone to let her know how the patient is doing and offer emotional support.  Scott Lawrence is also having a very difficult time letting go.   Length of Stay: 7  Current Medications: Scheduled Meds:  . antiseptic oral rinse  7 mL Mouth Rinse QID  . budesonide (PULMICORT) nebulizer solution  0.5 mg Nebulization BID  . chlorhexidine gluconate (SAGE KIT)  15 mL Mouth Rinse BID  . collagenase   Topical Daily  . famotidine  20 mg Per Tube BID  . ipratropium-albuterol  3 mL Nebulization QID  . LORazepam  1 mg Intravenous BID    Continuous Infusions: . sodium chloride 10 mL/hr at 05/05/16 1159    PRN Meds: acetaminophen (TYLENOL) oral liquid 160 mg/5 mL,  glycopyrrolate **OR** glycopyrrolate **OR** glycopyrrolate, haloperidol **OR** haloperidol **OR** haloperidol lactate, levalbuterol, LORazepam, morphine injection, ondansetron (ZOFRAN) IV, polyvinyl alcohol, silver nitrate applicators  Physical Exam    Wd, chronically ill appearing man.   Smiles at me.   CV rrr difficult to hear over lung sounds Resp vent dependent, mildly increased work of breathing, unable to generate a productive cough.  Lungs sound very wet, crackles/rals. Abd soft, PEG in place. Utilizing some abdominal muscles to breath.  Vital  Signs: BP 133/71 mmHg  Pulse 99  Temp(Src) 98.2 F (36.8 C) (Oral)  Resp 19  Ht _0  (1.854 m)  Wt 94.802 kg (209 lb)  BMI 27.58 kg/m2  SpO2 87% SpO2: SpO2: (!) 87 % O2 Device: O2 Device: Ventilator O2 Flow Rate:    Intake/output summary:   Intake/Output Summary (Last 24 hours) at 05/07/16 1353 Last data filed at 05/07/16 0500  Gross per 24 hour  Intake    110 ml  Output   1050 ml  Net   -940 ml   LBM: Last BM Date: 04/30/16 Baseline Weight: Weight: 81.647 kg (180 lb) Most recent weight: Weight: 94.802 kg (209 lb)       Palliative Assessment/Data:    Flowsheet Rows        Most Recent Value   Intake Tab    Referral Department  Hospitalist   Unit at Time of Referral  Intermediate Care Unit   Palliative Care Primary Diagnosis  Other (Comment)   Date Notified  05/03/16   Palliative Care Type  New Palliative care   Reason for referral  Clarify Goals of Care   Date of Admission  04/10/2016   Date first seen by Palliative Care  05/04/16   # of days Palliative referral response time  1 Day(s)   # of days IP prior to Palliative referral  4   Clinical Assessment    Palliative Performance Scale Score  20%   Pain Min Last 24 hours  Not able to report   Dyspnea Max Last 24 Hours  6   Dyspnea Min Last 24 hours  2   Anxiety Max Last 24 Hours  6   Anxiety Min Last 24 Hours  2   Psychosocial & Spiritual Assessment    Palliative  Care Outcomes    Patient/Family meeting held?  Yes   Who was at the meeting?  wife, daughter and 1 son on the phone (as they are 3 hours away)   Palliative Care Outcomes  Improved pain interventions, Improved non-pain symptom therapy, Counseled regarding hospice      Patient Active Problem List   Diagnosis Date Noted  . Pressure ulcer 05/04/2016  . Palliative care encounter   . Goals of care, counseling/discussion   . Encounter for hospice care discussion   . Anxiety state   . Dyspnea   . Loculated pleural effusion   . Pleural effusion on right   . Acute on chronic respiratory failure with hypoxia ([place])   . Sepsis, unspecified organism ([place])   . Chronic hypotension   . Pleural effusion   . Ventilator dependent ([place])   . Hypokalemia   . CAD in native artery   . Paraplegic spinal paralysis ([place])   . Chronic atrial fibrillation ([place])   . Compression fracture of C-spine ([place])   . Pleural effusion, right   . Spinal fracture of T1 vertebra ([place])   . Protein-calorie malnutrition ([place])   . HCAP (healthcare-associated pneumonia) 04/30/2016  . Atrial flutter ([place])   . Tachycardia   . Chronic respiratory failure with hypoxia ([place])   . H/O tracheostomy ([place])   . Protein calorie malnutrition ([place])   . Paraplegia [facility])     Palliative Care Assessment & Plan   Patient Profile:  75 y.o. male with past medical history of CAD, STEMI in 2004.  In March 2017 he fell and developed paraplegic below the chest from a cervical spine injury.  He has a Trach/PEG and is vent dependent.  He has multiple large wounds and poor albumin.  He has had recurrent pneumonia and presents from [facility] with HCAP as well as left and right pleural effusions.   Recommendations/Plan:  DNR  Terminal wean planned for Monday at 2:00 pm.  This will allow out of town family to visit over the weekend.  Ativan scheduled and PRN  Morphine PRN.  Ok to remove aspen collar, please support his neck as  needed.  Appreciate Chaplain Services   Code Status: DNR  Prognosis:   Hours - Days  Discharge Planning:  Anticipated Hospital Death  Care plan was discussed with wife / family.    Thank you for allowing the Palliative Medicine Team to assist in the care of this patient.   Time In: 1:00 Time Out: 1:35 Total Time 35 min  no      Greater than 50%  of this time was spent counseling and coordinating care related to the above assessment and plan.  Imogene Burn, PA-C Palliative Medicine Pager: [phone]  Please contact Palliative Medicine Team phone at [phone] for questions and concerns.

## 2016-05-07 NOTE — Progress Notes (Signed)
SLP Cancellation Note  Patient Details Name: Scott Lawrence MRN: [id number] DOB: [birth year]   Cancelled treatment:     Pt has declined requests to try inline PMV again.  Provided with more accessible alphabet board as alternative to lip reading, which has been intermittently successful.        Blenda MountsCouture, Haniah Penny Laurice 05/07/2016, 2:59 PM

## 2016-05-08 MED ORDER — METOPROLOL TARTRATE 5 MG/5ML IV SOLN
10.0000 mg | Freq: Four times a day (QID) | INTRAVENOUS | Status: DC | PRN
  Filled 2016-05-08: qty 10

## 2016-05-08 MED ORDER — MORPHINE SULFATE (PF) 2 MG/ML IV SOLN
1.0000 mg | INTRAVENOUS | Status: DC | PRN
  Administered 2016-05-08 – 2016-05-09 (×4): 2 mg via INTRAVENOUS
  Filled 2016-05-08 (×6): qty 1

## 2016-05-08 MED ORDER — LORAZEPAM 2 MG/ML IJ SOLN: 0.5000 mg | INTRAMUSCULAR | Status: DC | PRN

## 2016-05-08 NOTE — Progress Notes (Signed)
PROGRESS NOTE  [place] TEAM 1 - Stepdown/ICU TEAM  Scott Lawrence  DZH:[id number] DOB: [birth year] DOA: 04/09/2016 PCP: Verneita Griffes, MD   Brief Narrative:  75 y.o. male with Hx CAD, STEMI 12/29/02, Hypotension, HLD, S/P fracture wrist 06/05/14, Pneumothorax,C6 / C7 injury in March 2017 with resulting paraplegia > trach / PEG (performed at [facility]) who presented to [facility] ED 6/29 from [facility] due to hypotension and tachycardia. SBP was in the 70's but typically runs in low 100's and pt is chronically on midodrine.  In the ED he remained mildly hypotensive with SBP in 90s. He was started on low dose levophed and SBP improved to around 110.CXR revealed right mid to lower consolidation and CTA showed mod right sided pleural effusion with underlying opacity.  Assessment & Plan:   Sepsis due to Pseudomonas and Acinetobacter HCAP -completed 5+ days of antibiotic therapy  Pleural effusion -IR right thoracentesis completed with results consistent with exudate - fluid collection incompletely drained and follow-up imaging notes new fluid accumulation on left as well - family has transitioned to comfort focused care only   Chronic respiratory failure - s/p trach March 2017 - ventilator dependent -Per patient/wife [facility] had been attempting to wean patient; at times tolerating 3-4 days off vent, then usually relapsing with infection or atelectasis/inability clear secretions -While at [facility] the patient has made no progress whatsoever in regards to weaning and PCCM feels this is highly unlikely  -The family reported that the patient has a living will and would not wish to be on ventilator support long-term -continue comfort focused care w/ plan to terminally wean Monday  ?Chronic Parox Afib  -CHA2DS2VASC score 2 - comfort focused care only at this time  -unclear chronicity of this problem - I find not mention of this in a review of records from [place]  CAD/STEMI  Chronic Hypotension   Protein  calorie malnutrition  Mild hyperglycemia  -A1c NOT c/w DM   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 -Aspen collar now off at pt request as focus is on comfort only at this time   DVT prophylaxis: comfort focused care  Code Status: NO CODE BLUE Family Communication: Spoke with brother at bedside at length Disposition Plan: plan for terminal wean on Monday - patient with some decline today suggesting death may be eminent  Consultants:  PCCM  Palliative Care  Dr. Kary Kos Neurosurgery - phone    Procedures/Significant Events:  3/6 Ct Head W/o Contrast (at [facility] ): - frontal abnormality right inferior frontal lobe artifact  vs contusion  3/6 Ct Spine Cervical Wo [facility] ):-severe cervical spine injury at the C6/C7 disc space. -Grade 2/3 anterolisthesis in conjunction with bilateral dislocated, locked facets. -Associated fracture right inferior C6 facet,Severe cord compression as a result.  -Suspect subtle marginal avulsion fracture along the anterior superior corner of the C7 vertebral body in relation to anterior longitudinal ligament. 3/7Ct Cta Chest W/o And [facility] ): -Sternal fracture with associated chest wall and mediastinal hematoma.-Posterior pneumomediastinum closely associated with esophageal wall. -Centrally located focus of gas which appears to be within the esophageal wall,concerning for esophageal injury. - -Trace associated right pneumothorax at the superomedial aspect.  3/7 fusion posterior thoracic; C4-T3 POSTERIOR CERVICAL THORACIC FUSION BY DR. [facility] ) 3/7 EGD (at [facility] )[place] performed by Dalyai, Maia Petties, 3/13(at [facility] ) Tracheostomy  performed by Dr Daisy Blossom R., 6/29 CXR > right mid to lower consolidation  6/29 CTA chest > mod right sided pleural effusion with underlying opacity  7/1 C-spine x-ray clearing;- Abnormal alignment is seen at  C6-7.  7/1 T-spine x-ray:-Mild compression deformities of T7 and T8-8 as well as mild depression of the superior endplate of T4 unchanged from the recent CT scan and age indeterminate. - Posterior fusion hardware from approximately C4-T3 intact. -7/2 transfuse 1 unit PRBC  Antimicrobials: Vanc 6/29 > 7/3 Zosyn 6/29 > 7/4 Merrem 7/4  Subjective: No evidence of discomfort at this time.  Blood pressure low and patient tachycardic.    Objective: Filed Vitals:   05/07/16 2127 05/08/16 0007 05/08/16 0400 05/08/16 0746  BP: 139/78 140/75 144/80   Pulse: 116 101 101   Temp:  97.4 F (36.3 C) 97.4 F (36.3 C)   TempSrc:  Oral Oral   Resp: 35 33 28   Height:      Weight:      SpO2: 90% 91% 92% 93%    Intake/Output Summary (Last 24 hours) at 05/08/16 0921 Last data filed at 05/08/16 0428  Gross per 24 hour  Intake    240 ml  Output    850 ml  Net   -610 ml   Filed Weights   05/04/16 0416 05/05/16 0437 05/06/16 0446  Weight: 98.3 kg (216 lb 11.4 oz) 96.163 kg (212 lb) 94.802 kg (209 lb)   Examination: General: No acute distress evident Despite tachycardia Lungs: Poor air movement throughout on vent   CBC:  Recent Labs Lab 05/02/16 0833 05/03/16 0654 05/04/16 0420  WBC 10.4 14.2* 17.7*  HGB 7.3* 7.1* 9.1*  HCT 24.1* 23.2* 29.5*  MCV 86.7 85.9 84.3  PLT 340 357 683   Basic Metabolic Panel:  Recent Labs Lab 05/02/16 0341 05/03/16 0654 05/04/16 0420 05/04/16 1124  NA 137 136 137 136  K 3.0* 3.4* 2.9* 3.5  CL 103 104 104 105  CO2 27 26 24 24   GLUCOSE 128* 98 84 88  BUN 11 7 8 8   CREATININE <0.30* 0.33* 0.40* 0.43*  CALCIUM 7.7* 8.0* 7.8* 7.8*  MG 1.8 1.8  --  1.8   GFR: Estimated Creatinine Clearance: 92.9 mL/min (by C-G formula based on Cr of 0.43).   Liver Function Tests:  Recent Labs Lab 05/02/16 0341 05/03/16 0654 05/04/16 0420  AST 19 21 19   ALT 27 28 24   ALKPHOS 85 89 85  BILITOT 0.2* 0.8 1.0  PROT 5.6* 5.8* 6.0*  ALBUMIN 1.4* 1.6* 1.5*    CBG:  Recent Labs Lab 05/05/16 0757 05/05/16 1109 05/05/16 1734 05/05/16 1948 05/06/16 1237  GLUCAP 88 86 93 82 73    Recent Results (from the past 240 hour(s))  Blood Culture (routine x 2)     Status: None   Collection Time: 04/10/2016  6:20 PM  Result Value Ref Range Status   Specimen Description BLOOD LEFT HAND  Final   Special Requests BOTTLES DRAWN AEROBIC ONLY 5CC  Final   Culture NO GROWTH 5 DAYS  Final   Report Status 05/04/2016 FINAL  Final  Blood Culture (routine x 2)     Status: None   Collection Time: 04/22/2016  6:20 PM  Result Value Ref Range Status   Specimen Description BLOOD LEFT FOREARM  Final   Special Requests IN PEDIATRIC BOTTLE 3CC  Final   Culture NO GROWTH 5 DAYS  Final   Report Status 05/04/2016 FINAL  Final  Urine culture     Status: Abnormal   Collection Time: 04/22/2016  6:47  PM  Result Value Ref Range Status   Specimen Description URINE, CATHETERIZED  Final   Special Requests NONE  Final   Culture <10,000 COLONIES/mL INSIGNIFICANT GROWTH (A)  Final   Report Status 05/01/2016 FINAL  Final  MRSA PCR Screening     Status: None   Collection Time: 04/30/16  4:40 AM  Result Value Ref Range Status   MRSA by PCR NEGATIVE NEGATIVE Final    Comment:        The GeneXpert MRSA Assay (FDA approved for NASAL specimens only), is one component of a comprehensive MRSA colonization surveillance program. It is not intended to diagnose MRSA infection nor to guide or monitor treatment for MRSA infections.   Urine culture     Status: None   Collection Time: 04/30/16 11:27 AM  Result Value Ref Range Status   Specimen Description URINE, CATHETERIZED  Final   Special Requests NONE  Final   Culture NO GROWTH  Final   Report Status 05/01/2016 FINAL  Final  Culture, respiratory (NON-Expectorated)     Status: None   Collection Time: 05/01/16 10:17 AM  Result Value Ref Range Status   Specimen Description TRACHEAL ASPIRATE  Final   Special Requests NONE  Final    Gram Stain   Final    ABUNDANT WBC PRESENT, PREDOMINANTLY PMN NO SQUAMOUS EPITHELIAL CELLS SEEN FEW GRAM NEGATIVE RODS RARE GRAM NEGATIVE COCCI IN PAIRS    Culture   Final    ABUNDANT PSEUDOMONAS AERUGINOSA ABUNDANT ACINETOBACTER CALCOACETICUS/BAUMANNII COMPLEX MULTI-DRUG RESISTANT ORGANISM RESULT CALLED TO, READ BACK BY AND VERIFIED WITH: T HOOD 05/06/16 @ 1004 M VESTAL    Report Status 05/06/2016 FINAL  Final   Organism ID, Bacteria PSEUDOMONAS AERUGINOSA  Final   Organism ID, Bacteria ACINETOBACTER CALCOACETICUS/BAUMANNII COMPLEX  Final      Susceptibility   Pseudomonas aeruginosa - MIC*    CEFTAZIDIME 16 INTERMEDIATE Intermediate     CIPROFLOXACIN 1 SENSITIVE Sensitive     GENTAMICIN 2 SENSITIVE Sensitive     IMIPENEM 2 SENSITIVE Sensitive     PIP/TAZO 32 SENSITIVE Sensitive     CEFEPIME 8 INTERMEDIATE Intermediate     * ABUNDANT PSEUDOMONAS AERUGINOSA   Acinetobacter calcoaceticus/baumannii complex - MIC*    CEFTAZIDIME >=64 RESISTANT Resistant     CEFTRIAXONE 16 INTERMEDIATE Intermediate     CIPROFLOXACIN >=4 RESISTANT Resistant     GENTAMICIN >=16 RESISTANT Resistant     IMIPENEM >=16 RESISTANT Resistant     PIP/TAZO >=128 RESISTANT Resistant     TRIMETH/SULFA >=320 RESISTANT Resistant     CEFEPIME >=64 RESISTANT Resistant     AMPICILLIN/SULBACTAM 8 SENSITIVE Sensitive     * ABUNDANT ACINETOBACTER CALCOACETICUS/BAUMANNII COMPLEX     Scheduled Meds: . antiseptic oral rinse  7 mL Mouth Rinse QID  . budesonide (PULMICORT) nebulizer solution  0.5 mg Nebulization BID  . chlorhexidine gluconate (SAGE KIT)  15 mL Mouth Rinse BID  . collagenase   Topical Daily  . famotidine  20 mg Per Tube BID  . ipratropium-albuterol  3 mL Nebulization QID  . LORazepam  1 mg Intravenous BID     LOS: 8 days    Cherene Altes, MD [facility] Office  [phone] Pager - Text Page per Amion as per below:  On-Call/Text Page:      [email or web address]      password [email or web address]  If  7PM-7AM, please contact night-coverage [email or web address] Password [email or web address] 05/08/2016, 9:21 AM

## 2016-05-09 DIAGNOSIS — R0600 Dyspnea, unspecified: Secondary | ICD-10-CM

## 2016-05-09 DIAGNOSIS — Z515 Encounter for palliative care: Secondary | ICD-10-CM

## 2016-05-09 NOTE — Progress Notes (Signed)
PROGRESS NOTE  [place] TEAM 1 - Stepdown/ICU TEAM  Scott Lawrence  SFS:[id number] DOB: Jul 22, 1941 DOA: 04/24/2016 PCP: Verneita Griffes, MD   Brief Narrative:  75 y.o. male with Hx CAD, STEMI 12/29/02, Hypotension, HLD, S/P fracture wrist 06/05/14, Pneumothorax,C6 / C7 injury in March 2017 with resulting paraplegia > trach / PEG (performed at [facility]) who presented to [facility] ED 6/29 from [facility] due to hypotension and tachycardia. SBP was in the 70's but typically runs in low 100's and pt is chronically on midodrine.  In the ED he remained mildly hypotensive with SBP in 90s. He was started on low dose levophed and SBP improved to around 110.CXR revealed right mid to lower consolidation and CTA showed mod right sided pleural effusion with underlying opacity.  Subjective: More alert today.  Appears comfortable.  No signs of distress.  Denies having any questions or concerns.  Assessment & Plan:   Sepsis due to Pseudomonas and Acinetobacter HCAP -completed 5+ days of antibiotic therapy  Pleural effusion -IR right thoracentesis completed with results consistent with exudate - fluid collection incompletely drained and follow-up imaging notes new fluid accumulation on left as well - family has transitioned to comfort focused care only   Chronic respiratory failure - s/p trach March 2017 - ventilator dependent -Per patient/wife [facility] had been attempting to wean patient; at times tolerating 3-4 days off vent, then usually relapsing with infection or atelectasis/inability clear secretions -While at [facility] the patient has made no progress whatsoever in regards to weaning and PCCM feels this is highly unlikely  -The family reported that the patient has a living will and would not wish to be on ventilator support long-term -continue comfort focused care w/ plan for terminal wean Monday  ?Chronic Parox Afib  -CHA2DS2VASC score 2 - comfort focused care only at this time  -unclear chronicity of this  problem - I find no mention of this in a review of records from [place]  CAD/STEMI  Chronic Hypotension   Protein calorie malnutrition  Mild hyperglycemia  -A1c NOT c/w DM   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 -Aspen collar now off at pt request as focus is on comfort only at this time   DVT prophylaxis: comfort focused care  Code Status: NO CODE BLUE Family Communication: Spoke with wife at bedside Disposition Plan: terminal wean Monday   Consultants:  PCCM  Palliative Care  Dr. Kary Kos Neurosurgery - phone    Procedures/Significant Events:  3/6 Ct Head W/o Contrast (at [facility] ): - frontal abnormality right inferior frontal lobe artifact  vs contusion  3/6 Ct Spine Cervical Wo [facility] ):-severe cervical spine injury at the C6/C7 disc space. -Grade 2/3 anterolisthesis in conjunction with bilateral dislocated, locked facets. -Associated fracture right inferior C6 facet,Severe cord compression as a result.  -Suspect subtle marginal avulsion fracture along the anterior superior corner of the C7 vertebral body in relation to anterior longitudinal ligament. 3/7Ct Cta Chest W/o And [facility] ): -Sternal fracture with associated chest wall and mediastinal hematoma.-Posterior pneumomediastinum closely associated with esophageal wall. -Centrally located focus of gas which appears to be within the esophageal wall,concerning for esophageal injury. - -Trace associated right pneumothorax at the superomedial aspect.  3/7 fusion posterior thoracic; C4-T3 POSTERIOR CERVICAL THORACIC FUSION BY DR. [facility] ) 3/7 EGD (at [facility] )[place] performed by Dalyai, Maia Petties, 3/13(at [facility] ) Tracheostomy  performed by Dr Theora Gianotti., 6/29 CXR >  right mid to lower consolidation  6/29 CTA chest > mod right sided pleural effusion with underlying opacity  7/1 C-spine x-ray clearing;-  Abnormal alignment is seen at C6-7.  7/1 T-spine x-ray:-Mild compression deformities of T7 and T8-8 as well as mild depression of the superior endplate of T4 unchanged from the recent CT scan and age indeterminate. - Posterior fusion hardware from approximately C4-T3 intact. -7/2 transfuse 1 unit PRBC  Antimicrobials: Vanc 6/29 > 7/3 Zosyn 6/29 > 7/4 Merrem 7/4  Objective: Filed Vitals:   05/09/16 0055 05/09/16 0415 05/09/16 0740 05/09/16 0853  BP:    131/65  Pulse:   89 94  Temp: 99 F (37.2 C) 97.6 F (36.4 C)  98.7 F (37.1 C)  TempSrc: Axillary Oral  Oral  Resp:   24 19  Height:      Weight:      SpO2:   96% 95%    Intake/Output Summary (Last 24 hours) at 05/09/16 1009 Last data filed at 05/09/16 0415  Gross per 24 hour  Intake    240 ml  Output    500 ml  Net   -260 ml   Filed Weights   05/04/16 0416 05/05/16 0437 05/06/16 0446  Weight: 98.3 kg (216 lb 11.4 oz) 96.163 kg (212 lb) 94.802 kg (209 lb)   Examination: General: No acute distress evident  CV:  Regular rate and rhythm Lungs: Poor air movement throughout - some trach secretions noted but not problematic   CBC:  Recent Labs Lab 05/03/16 0654 05/04/16 0420  WBC 14.2* 17.7*  HGB 7.1* 9.1*  HCT 23.2* 29.5*  MCV 85.9 84.3  PLT 357 212   Basic Metabolic Panel:  Recent Labs Lab 05/03/16 0654 05/04/16 0420 05/04/16 1124  NA 136 137 136  K 3.4* 2.9* 3.5  CL 104 104 105  CO2 26 24 24   GLUCOSE 98 84 88  BUN 7 8 8   CREATININE 0.33* 0.40* 0.43*  CALCIUM 8.0* 7.8* 7.8*  MG 1.8  --  1.8   GFR: Estimated Creatinine Clearance: 92.9 mL/min (by C-G formula based on Cr of 0.43).   Liver Function Tests:  Recent Labs Lab 05/03/16 0654 05/04/16 0420  AST 21 19  ALT 28 24  ALKPHOS 89 85  BILITOT 0.8 1.0  PROT 5.8* 6.0*  ALBUMIN 1.6* 1.5*   CBG:  Recent Labs Lab 05/05/16 0757 05/05/16 1109 05/05/16 1734 05/05/16 1948 05/06/16 1237  GLUCAP 88 86 93 82 73    Recent Results (from the  past 240 hour(s))  Blood Culture (routine x 2)     Status: None   Collection Time: 04/06/2016  6:20 PM  Result Value Ref Range Status   Specimen Description BLOOD LEFT HAND  Final   Special Requests BOTTLES DRAWN AEROBIC ONLY 5CC  Final   Culture NO GROWTH 5 DAYS  Final   Report Status 05/04/2016 FINAL  Final  Blood Culture (routine x 2)     Status: None   Collection Time: 04/05/2016  6:20 PM  Result Value Ref Range Status   Specimen Description BLOOD LEFT FOREARM  Final   Special Requests IN PEDIATRIC BOTTLE 3CC  Final   Culture NO GROWTH 5 DAYS  Final   Report Status 05/04/2016 FINAL  Final  Urine culture     Status: Abnormal   Collection Time: 04/17/2016  6:47 PM  Result Value Ref Range Status   Specimen Description URINE, CATHETERIZED  Final   Special Requests NONE  Final  Culture <10,000 COLONIES/mL INSIGNIFICANT GROWTH (A)  Final   Report Status 05/01/2016 FINAL  Final  MRSA PCR Screening     Status: None   Collection Time: 04/30/16  4:40 AM  Result Value Ref Range Status   MRSA by PCR NEGATIVE NEGATIVE Final    Comment:        The GeneXpert MRSA Assay (FDA approved for NASAL specimens only), is one component of a comprehensive MRSA colonization surveillance program. It is not intended to diagnose MRSA infection nor to guide or monitor treatment for MRSA infections.   Urine culture     Status: None   Collection Time: 04/30/16 11:27 AM  Result Value Ref Range Status   Specimen Description URINE, CATHETERIZED  Final   Special Requests NONE  Final   Culture NO GROWTH  Final   Report Status 05/01/2016 FINAL  Final  Culture, respiratory (NON-Expectorated)     Status: None   Collection Time: 05/01/16 10:17 AM  Result Value Ref Range Status   Specimen Description TRACHEAL ASPIRATE  Final   Special Requests NONE  Final   Gram Stain   Final    ABUNDANT WBC PRESENT, PREDOMINANTLY PMN NO SQUAMOUS EPITHELIAL CELLS SEEN FEW GRAM NEGATIVE RODS RARE GRAM NEGATIVE COCCI IN  PAIRS    Culture   Final    ABUNDANT PSEUDOMONAS AERUGINOSA ABUNDANT ACINETOBACTER CALCOACETICUS/BAUMANNII COMPLEX MULTI-DRUG RESISTANT ORGANISM RESULT CALLED TO, READ BACK BY AND VERIFIED WITH: T HOOD 05/06/16 @ 1004 M VESTAL    Report Status 05/06/2016 FINAL  Final   Organism ID, Bacteria PSEUDOMONAS AERUGINOSA  Final   Organism ID, Bacteria ACINETOBACTER CALCOACETICUS/BAUMANNII COMPLEX  Final      Susceptibility   Pseudomonas aeruginosa - MIC*    CEFTAZIDIME 16 INTERMEDIATE Intermediate     CIPROFLOXACIN 1 SENSITIVE Sensitive     GENTAMICIN 2 SENSITIVE Sensitive     IMIPENEM 2 SENSITIVE Sensitive     PIP/TAZO 32 SENSITIVE Sensitive     CEFEPIME 8 INTERMEDIATE Intermediate     * ABUNDANT PSEUDOMONAS AERUGINOSA   Acinetobacter calcoaceticus/baumannii complex - MIC*    CEFTAZIDIME >=64 RESISTANT Resistant     CEFTRIAXONE 16 INTERMEDIATE Intermediate     CIPROFLOXACIN >=4 RESISTANT Resistant     GENTAMICIN >=16 RESISTANT Resistant     IMIPENEM >=16 RESISTANT Resistant     PIP/TAZO >=128 RESISTANT Resistant     TRIMETH/SULFA >=320 RESISTANT Resistant     CEFEPIME >=64 RESISTANT Resistant     AMPICILLIN/SULBACTAM 8 SENSITIVE Sensitive     * ABUNDANT ACINETOBACTER CALCOACETICUS/BAUMANNII COMPLEX     Scheduled Meds: . antiseptic oral rinse  7 mL Mouth Rinse QID  . chlorhexidine gluconate (SAGE KIT)  15 mL Mouth Rinse BID  . famotidine  20 mg Per Tube BID  . LORazepam  1 mg Intravenous BID     LOS: 9 days    Cherene Altes, MD [facility] Office  [phone] Pager - Text Page per Amion as per below:  On-Call/Text Page:      [email or web address]      password [email or web address]  If 7PM-7AM, please contact night-coverage [email or web address] Password [email or web address] 05/09/2016, 10:09 AM

## 2016-05-10 DIAGNOSIS — Z7189 Other specified counseling: Secondary | ICD-10-CM

## 2016-05-10 MED ORDER — MORPHINE SULFATE 25 MG/ML IV SOLN
1.0000 mg/h | INTRAVENOUS | Status: DC
  Administered 2016-05-10: 1 mg/h via INTRAVENOUS
  Filled 2016-05-10: qty 10

## 2016-05-10 MED ORDER — LORAZEPAM 2 MG/ML IJ SOLN
2.0000 mg | INTRAMUSCULAR | Status: DC | PRN
  Administered 2016-05-10: 2 mg via INTRAVENOUS
  Filled 2016-05-10 (×2): qty 1

## 2016-05-10 MED ORDER — MORPHINE BOLUS VIA INFUSION
4.0000 mg | INTRAVENOUS | Status: DC | PRN
  Administered 2016-05-10 (×2): 4 mg via INTRAVENOUS
  Filled 2016-05-10 (×3): qty 4

## 2016-05-10 MED ORDER — MORPHINE BOLUS VIA INFUSION
8.0000 mg | INTRAVENOUS | Status: AC
  Administered 2016-05-10: 8 mg via INTRAVENOUS

## 2016-05-10 MED ORDER — GLYCOPYRROLATE 0.2 MG/ML IJ SOLN
0.2000 mg | INTRAMUSCULAR | Status: DC
  Administered 2016-05-10: 0.2 mg via INTRAVENOUS
  Filled 2016-05-10 (×3): qty 1

## 2016-05-10 MED ORDER — LORAZEPAM 2 MG/ML IJ SOLN
2.0000 mg | INTRAMUSCULAR | Status: AC
  Administered 2016-05-10: 2 mg via INTRAVENOUS

## 2016-06-01 NOTE — Progress Notes (Signed)
   31-May-2016 1500  Clinical Encounter Type  Visited With Patient and family together  Visit Type Death;Patient actively dying  Referral From Family;Nurse  Consult/Referral To Chaplain  Spiritual Encounters  Spiritual Needs Emotional;Grief support  Stress Factors  Patient Stress Factors Exhausted;Health changes  Family Stress Factors Exhausted;Health changes   Chaplain responded to patient's room.  Patient was terminally weaned and family was present at bedside.  Chaplain lead prayer for patient, family and healthcare staff.  Chaplain offered ministry of presence and prayed again with family once patient passed.  Rosezella FloridaLisa M Kassidee Narciso 05/11/16 3:31 PM

## 2016-06-01 NOTE — Progress Notes (Signed)
Asked by Dr. Neale BurlyFreeman to assist with communication as patient more alert this am. Plans are to return at 1130 for in-line PMV attempts to facilitate communication per MD, patient, and family request. Will return with MD and RT at 1130.   Ferdinand LangoLeah Johannah Rozas MA, CCC-SLP [phone]

## 2016-06-01 NOTE — Discharge Summary (Signed)
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 male with Hx CAD s/p STEMI 12/29/02, Hypotension, HLD, S/P fracture wrist 06/05/14, Pneumothorax,C6 / C7 injury in March 2017 with resulting paraplegia > trach / PEG (performed at [facility]) who presented to [facility] ED 2023-05-07 from [facility] due to hypotension and tachycardia. SBP was in the 70's but typically runs in low 100's and pt is chronically on midodrine.  In the ED he remained mildly hypotensive with SBP in 90s. He was started on low dose levophed and SBP improved to around 110.CXR revealed right mid to lower consolidation and CTA showed mod right sided pleural effusion with underlying opacity.  Hospital Course:  The patient was admitted to the acute units and aggressive medical care administered.  Pulmonary Critical Care Medicine attended to his ventilator management and the initial use of vasopressors.  Initially the patient stabilized somewhat.  His respiratory status however waxed and waned significantly.  Further evaluation noted a complex pleural effusion on the right as well as a developing effusion on the left.  This was felt to be complicating matters in regards to attempts to wean him from the ventilator.  Despite ongoing efforts he was not able to be successfully weaned from the vent and did not appear to be anywhere near this.  In fact the overwhelming feeling was that he was not likely to ever be able to be freed from the ventilator.  TCTS was consulted and evaluated his effusions and did not  feel that he was an appropriate candidate for the operating room due to his fragile state.  With all of this information together Palliative Care was consulted and carried out lengthy discussions with the patient's family.  The patient's family all agreed that the patient had clearly stated previously that he would not wish to live a life connected to a ventilator.  This was discussed with the pt himself during brief periods of lucidity and he agreed that he did not wish to continue his life the way it had been since his accident (on the ventilator).  The patient was then transitioned to comfort focused care and a time was sent to accomplish a terminal wean.  On 05/17/16 the Palliative Care physician was at bedside with multiple family members and the patient was removed from the ventilator.  He had been premedicated with pain medication and was directly observed by the physician to assure that he was not struggling or uncomfortable.  The patient rapidly died with his family at his bedside.  The following is a list of the active medical problems at the time of his death:  Sepsis due to Pseudomonas and Acinetobacter HCAP -completed 5+ days of antibiotic therapy  Pleural effusion -IR right thoracentesis completed with results consistent with exudate - fluid collection incompletely drained and follow-up imaging notes new fluid accumulation on left as well - family has transitioned to comfort focused care only   Chronic respiratory failure - s/p trach March 2017 - ventilator dependent -Per patient/wife [facility] had been attempting to wean patient; at times tolerating 3-4 days off vent, then usually relapsing  with infection or atelectasis/inability clear secretions -[facility] the patient made no progress whatsoever in regards to weaning and PCCM felt this was highly unlikely to change -The family reported that the patient had a living will and would not wish to be on ventilator support  long-term  ?Chronic Parox Afib  -CHA2DS2VASC score 2  -unclear chronicity of this problem  CAD/STEMI  Chronic Hypotension  Protein calorie malnutrition  Mild hyperglycemia  -A1c NOT c/w DM   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 -Aspen collar now off at pt request as focus is on comfort only at this time   Signed:  MCCLUNG,JEFFREY T  [facility] 05/31/2016, 4:53 PM

## 2016-06-01 NOTE — Progress Notes (Signed)
Remaining morphine from IV bag wasted in sink with witness Lovie MacadamiaNadine Wellington, Charity fundraiserN.

## 2016-06-01 NOTE — Final Progress Note (Addendum)
Present for terminal wean.  Patient was premedicated with morphine as well as ativan.  FIO2 to room air and vent discontinued.  He had some noted labored respirations and vent restarted while addition comfort medications provided.  Weaned to room air.  I was present at the bedside throughout and provided additional comfort meds as needed.  Little respiratory effort followed by cessation of respiratory activity.  I examined patient.  Pupils unreactive. No corneal reflex. Does not withdraw to stimuli and no central or peripheral pulses present. No cardiac or respiratory activity auscultated for >1 minute.  Time of death [id number].  Attending notified via page.  Romie MinusGene Khamila Bassinger, MD [facility] Palliative Medicine Team [phone]

## 2016-06-01 NOTE — Progress Notes (Signed)
Daily Progress Note   Patient Name: Scott Lawrence       Date: 05/11/2016 DOB: 02/06/1941  Age: 75 y.o. MRN#: [id number] Attending Physician: No att. providers found Primary Care Physician: Scott Griffes, MD Admit Date: 04/14/2016  Reason for Consultation/Follow-up: Establishing goals of care  Subjective: Met with Scott Lawrence and his wife, Scott Lawrence.  We had a long discussion regarding his clinical course and the fact that he has been clear in the past regarding desire not to be dependent on life support.  We discussed plan for withdrawal of ventilator support.  Patient expressed understanding plan and in agreement that this would be in line with his wishes.   Length of Stay: 10  Current Medications: Scheduled Meds:    Continuous Infusions:   PRN Meds:   Physical Exam    Wd, chronically ill appearing man. Mouths answers to questions. CV rrr difficult to hear over lung sounds Resp vent dependent, mildly increased work of breathing, unable to generate a productive cough.  Lungs sound very wet, crackles/rals. Abd soft, PEG in place. Utilizing some abdominal muscles to breath.  Vital Signs: BP 115/54 mmHg  Pulse 105  Temp(Src) 99 F (37.2 C) (Oral)  Resp 25  Ht 6' 1"  (1.854 m)  Wt 94.8 kg (208 lb 15.9 oz)  BMI 27.58 kg/m2  SpO2 96% SpO2: SpO2: 96 % O2 Device: O2 Device: Ventilator O2 Flow Rate:    Intake/output summary:  No intake or output data in the 24 hours ending 05/11/16 2300 LBM: Last BM Date: 05/06/16 Baseline Weight: Weight: 81.647 kg (180 lb) Most recent weight: Weight: 94.8 kg (208 lb 15.9 oz)       Palliative Assessment/Data: 10%   Flowsheet Rows        Most Recent Value   Intake Tab    Referral Department  Hospitalist   Unit at Time of Referral   Intermediate Care Unit   Palliative Care Primary Diagnosis  Other (Comment)   Date Notified  05/03/16   Palliative Care Type  New Palliative care   Reason for referral  Clarify Goals of Care   Date of Admission  04/27/2016   Date first seen by Palliative Care  05/04/16   # of days Palliative referral response time  1 Day(s)   # of days IP prior to  Palliative referral  4   Clinical Assessment    Palliative Performance Scale Score  20%   Pain Min Last 24 hours  Not able to report   Dyspnea Max Last 24 Hours  6   Dyspnea Min Last 24 hours  2   Anxiety Max Last 24 Hours  6   Anxiety Min Last 24 Hours  2   Psychosocial & Spiritual Assessment    Palliative Care Outcomes    Patient/Family meeting held?  Yes   Who was at the meeting?  wife, daughter and 1 son on the phone (as they are 3 hours away)   Palliative Care Outcomes  Improved pain interventions, Improved non-pain symptom therapy, Counseled regarding hospice      Patient Active Problem List   Diagnosis Date Noted  . Pressure ulcer 05/04/2016  . Palliative care encounter   . Goals of care, counseling/discussion   . Encounter for hospice care discussion   . Anxiety state   . Dyspnea   . Loculated pleural effusion   . Pleural effusion on right   . Acute on chronic respiratory failure with hypoxia ([place])   . Sepsis, unspecified organism ([place])   . Chronic hypotension   . Pleural effusion   . Ventilator dependent ([place])   . Hypokalemia   . CAD in native artery   . Paraplegic spinal paralysis ([place])   . Chronic atrial fibrillation ([place])   . Compression fracture of C-spine ([place])   . Pleural effusion, right   . Spinal fracture of T1 vertebra ([place])   . Protein-calorie malnutrition ([place])   . HCAP (healthcare-associated pneumonia) 04/30/2016  . Atrial flutter ([place])   . Tachycardia   . Chronic respiratory failure with hypoxia ([place])   . H/O tracheostomy ([place])   . Protein calorie malnutrition ([place])   . Paraplegia [facility])      Palliative Care Assessment & Plan   Patient Profile:  75 y.o. male with past medical history of CAD, STEMI in 2004.  In March 2017 he fell and developed paraplegic below the chest from a cervical spine injury.  He has a Trach/PEG and is vent dependent.  He has multiple large wounds and poor albumin.  He has had recurrent pneumonia and presents from [facility] with HCAP as well as left and right pleural effusions.   Recommendations/Plan:  Terminal wean.  Counseled family and patient prior to wean.   Present for wean.  See separate note for death pronouncement.   Code Status: DNR  Prognosis:   Hours - Days  Discharge Planning:  Hospital death  Care plan was discussed with wife / family.    Thank you for allowing the Palliative Medicine Team to assist in the care of this patient.   Time In: 1420 Time Out: 1529 Total Time 69  Yes      Greater than 50%  of this time was spent counseling and coordinating care related to the above assessment and plan.  Scott Rough, MD [place] Team [phone]  Please contact Palliative Medicine Team phone at [phone] for questions and concerns.

## 2016-06-01 NOTE — Progress Notes (Signed)
Took patient off of vent and placed on room air per Dr. Neale BurlyFreeman from Palliative care.

## 2016-06-01 NOTE — Progress Notes (Signed)
Daily Progress Note   Patient Name: Scott Lawrence       Date: 2016/05/15 DOB: [birth year]  Age: 75 y.o. MRN#: [id number] Attending Physician: Cherene Altes, MD Primary Care Physician: Verneita Griffes, MD Admit Date: 04/18/2016  Reason for Consultation/Follow-up: Establishing goals of care  Subjective: Mr. Gopal appears comfortable.  He had been restless earlier and has received Ativan as well as morphine. He does not arouse to gentle verbal and tactile stimulation.  His wife, Scott Lawrence, is in the room. She reports that they had a good morning this morning and he was able to wake up some communicate with them. I asked her about his understanding of what is going on and she states that she thinks that he knows plan is for terminal wean, but states that she has not directly spoken with him about this because of his fluctuating mental status.  His mental status has been fluctuating, but at one point when he appeared to be more awake he told member staff that he was ready to die.  Length of Stay: 10  Current Medications: Scheduled Meds:  . antiseptic oral rinse  7 mL Mouth Rinse QID  . chlorhexidine gluconate (SAGE KIT)  15 mL Mouth Rinse BID  . famotidine  20 mg Per Tube BID  . LORazepam  1 mg Intravenous BID    Continuous Infusions: . sodium chloride 10 mL/hr at 05/08/16 0856    PRN Meds: acetaminophen (TYLENOL) oral liquid 160 mg/5 mL, glycopyrrolate **OR** glycopyrrolate **OR** glycopyrrolate, haloperidol **OR** haloperidol **OR** haloperidol lactate, levalbuterol, LORazepam, metoprolol, morphine injection, ondansetron (ZOFRAN) IV, polyvinyl alcohol, silver nitrate applicators  Physical Exam    Wd, chronically ill appearing man.   Speech throughout encounter and does not arouse.   CV  rrr difficult to hear over lung sounds Resp vent dependent, mildly increased work of breathing, wet with crackles/rales. Abd soft, PEG in place. Utilizing some abdominal muscles to breath.  Vital Signs: BP 127/61 mmHg  Pulse 76  Temp(Src) 99.7 F (37.6 C) (Axillary)  Resp 19  Ht 6' 1"  (1.854 m)  Wt 94.802 kg (209 lb)  BMI 27.58 kg/m2  SpO2 97% SpO2: SpO2: 97 % O2 Device: O2 Device: Ventilator O2 Flow Rate:    Intake/output summary:   Intake/Output Summary (Last 24 hours) at 05-15-2016 0710 Last data filed  at 26-May-2016 0600  Gross per 24 hour  Intake    120 ml  Output    825 ml  Net   -705 ml   LBM: Last BM Date: 04/30/16 Baseline Weight: Weight: 81.647 kg (180 lb) Most recent weight: Weight: 94.802 kg (209 lb)       Palliative Assessment/Data: 10%   Flowsheet Rows        Most Recent Value   Intake Tab    Referral Department  Hospitalist   Unit at Time of Referral  Intermediate Care Unit   Palliative Care Primary Diagnosis  Other (Comment)   Date Notified  05/03/16   Palliative Care Type  New Palliative care   Reason for referral  Clarify Goals of Care   Date of Admission  04/17/2016   Date first seen by Palliative Care  05/04/16   # of days Palliative referral response time  1 Day(s)   # of days IP prior to Palliative referral  4   Clinical Assessment    Palliative Performance Scale Score  20%   Pain Min Last 24 hours  Not able to report   Dyspnea Max Last 24 Hours  6   Dyspnea Min Last 24 hours  2   Anxiety Max Last 24 Hours  6   Anxiety Min Last 24 Hours  2   Psychosocial & Spiritual Assessment    Palliative Care Outcomes    Patient/Family meeting held?  Yes   Who was at the meeting?  wife, daughter and 1 son on the phone (as they are 3 hours away)   Palliative Care Outcomes  Improved pain interventions, Improved non-pain symptom therapy, Counseled regarding hospice      Patient Active Problem List   Diagnosis Date Noted  . Pressure ulcer 05/04/2016  .  Palliative care encounter   . Goals of care, counseling/discussion   . Encounter for hospice care discussion   . Anxiety state   . Dyspnea   . Loculated pleural effusion   . Pleural effusion on right   . Acute on chronic respiratory failure with hypoxia ([place])   . Sepsis, unspecified organism ([place])   . Chronic hypotension   . Pleural effusion   . Ventilator dependent ([place])   . Hypokalemia   . CAD in native artery   . Paraplegic spinal paralysis ([place])   . Chronic atrial fibrillation ([place])   . Compression fracture of C-spine ([place])   . Pleural effusion, right   . Spinal fracture of T1 vertebra ([place])   . Protein-calorie malnutrition ([place])   . HCAP (healthcare-associated pneumonia) 04/30/2016  . Atrial flutter ([place])   . Tachycardia   . Chronic respiratory failure with hypoxia ([place])   . H/O tracheostomy ([place])   . Protein calorie malnutrition ([place])   . Paraplegia [facility])     Palliative Care Assessment & Plan   Patient Profile:  74 y.o. male with past medical history of CAD, STEMI in 2004.  In March 2017 he fell and developed paraplegic below the chest from a cervical spine injury.  He has a Trach/PEG and is vent dependent.  He has multiple large wounds and poor albumin.  He has had recurrent pneumonia and presents from [facility] with HCAP as well as left and right pleural effusions.   Recommendations/Plan:  DNR  Terminal wean planned for Monday at 2:00 pm.  Family from out of town has been visiting over the weekend.  Ativan scheduled and PRN  Morphine PRN.  Appreciate  Chaplain Services   Code Status: DNR  Prognosis:   Hours - Days  Discharge Planning:  Anticipated Hospital Death  Care plan was discussed with wife / family.    Thank you for allowing the Palliative Medicine Team to assist in the care of this patient.   Time In: 1505 Time Out: 1525 Total Time 20  no      Greater than 50%  of this time was spent counseling and coordinating care related to the  above assessment and plan. Micheline Rough, MD [place] Team [phone]  Please contact Palliative Medicine Team phone at [phone] for questions and concerns.

## 2016-06-01 NOTE — Progress Notes (Addendum)
Notified by Bed Control that Medical Examiner must be notified of patient expiration to rule out if this is medical examiner case or not. Given name of Scott Lawrence [phone]. Voicemail left at number provided.  1703 - return call received from Scott Lawrence, this will be medical examiner case. All lines are ok to be pulled and can be transferred to the morgue. Medical Examiner to contact family.

## 2016-06-01 NOTE — Progress Notes (Signed)
Speech Language Pathology Treatment: Hillary BowPassy Muir Speaking valve  Patient Details Name: Scott CrazierScott H Limbach MRN: [id number] DOB: Dec 07, 1940 Today's Date: 05/07/2016 Time: [id number] SLP Time Calculation (min) (ACUTE ONLY): 13 min  Assessment / Plan / Recommendation Clinical Impression  Asked by Dr Neale BurlyFreeman and spouse to assist with in-line PMSV trial in hopes of facilitating verbal communication regarding patient's plan of care. Patient agreeable to trial. RT present to assist with ventilator adjustments. Cuff deflated by RT and PEEP dropped to 0.  Valve placed and patient with immediate low intensity phonation unfortunately followed by significant increase in RR to mid 50s, notable increase in WOB, and appearance of discomfort without ability to participate in conversation verbally and comfortably. Decision made to remove valve and place back on vent support with cuff inflated. Patient comfort noted. Wife in agreement to cease trials today. This SLP is available today if needed again for discussion with family this pm. MD aware. Will follow along for decision regarding goals of care.    HPI HPI: 75 y.o. male with past medical history of CAD, STEMI in 2004. In March 2017 he fell and developed paraplegic below the chest from a cervical spine injury. He has a Trach/PEG and is vent dependent. He has multiple large wounds and poor albumin. He has had recurrent pneumonia and presented from [facility] on 6/29 with HCAP as well as left and right pleural effusions.  Dx sepsis, chronic resp failure.  Attempted weaning several times at [facility], as many as 3-4 days off vent, then usually relapsing with infection or atelectasis/inability clear secretions.  Per brother, pt occasionally used inline PMV at [facility].  Inline PMV ordered 7/5 per Palliative Medicine.  Family working with Palliative Care and making decisions regarding goals.       SLP Plan  Continue with current plan of care (pending family meeting this pm)                  Plan: Continue with current plan of care (pending family meeting this pm)     GO             Ferdinand LangoLeah Junice Fei MA, CCC-SLP [phone]    Ferdinand LangoMcCoy Yong Grieser Meryl 05/12/2016, 12:49 PM

## 2016-06-01 DEATH — deceased

## 2017-05-19 IMAGING — CT CT CHEST W/O CM
2 of 3 series · 15 of 36 positions shown, 18 images · non-contrast
Comparison: Chest CT dated [DATE]

CLINICAL DATA: Cervical spine injury in Thursday December, 2015. Presented to[HOSPITAL]on [REDACTED] with septic shock. History of
pneumothorax.

EXAM:
CT CHEST WITHOUT CONTRAST
TECHNIQUE: Multidetector CT imaging of the chest was performed following the
standard protocol without IV contrast.

[Series 3: chest w/o 2mm st · axial · non-contrast · 0.78mm/px · z∈[+1058,+1376]mm · 12 of 187 slices shown, 15 images]
[im 14/187  mediastinal]
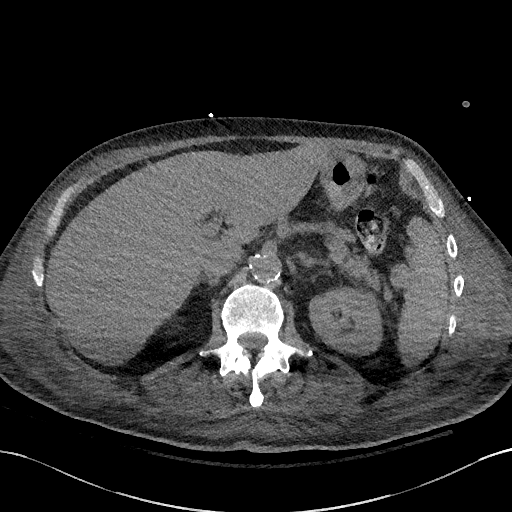
[im 14/187  lung]
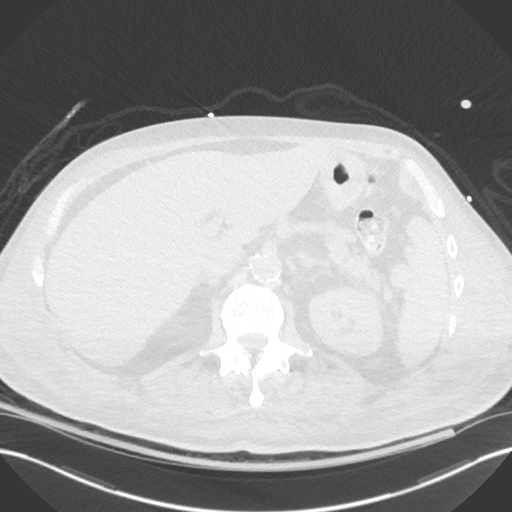
[im 28/187  lung]
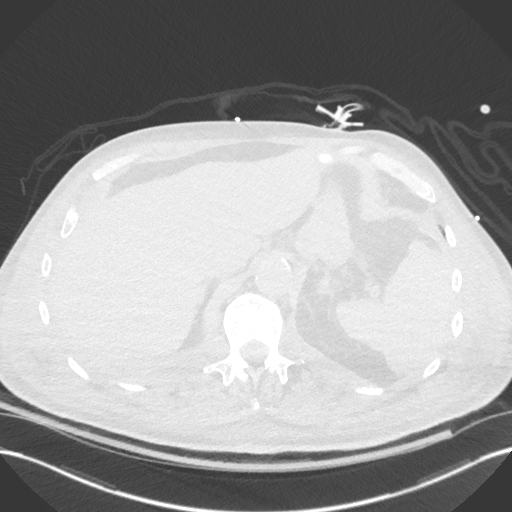
[im 42/187  lung]
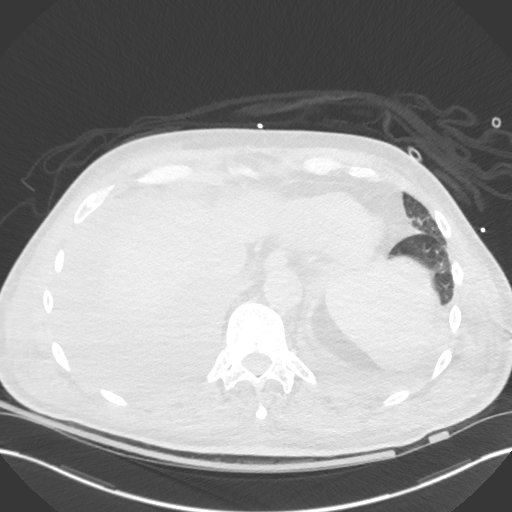
[im 56/187  lung]
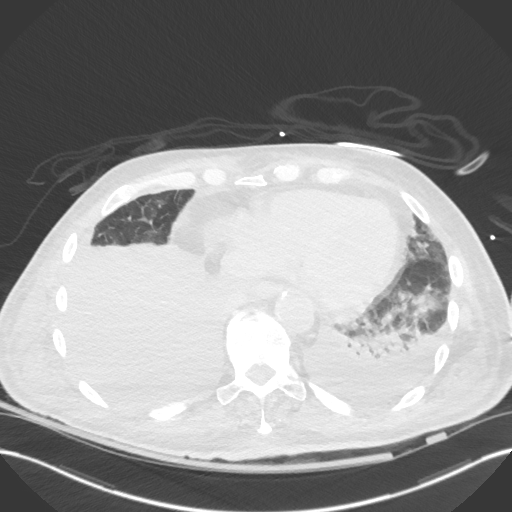
[im 69/187  mediastinal]
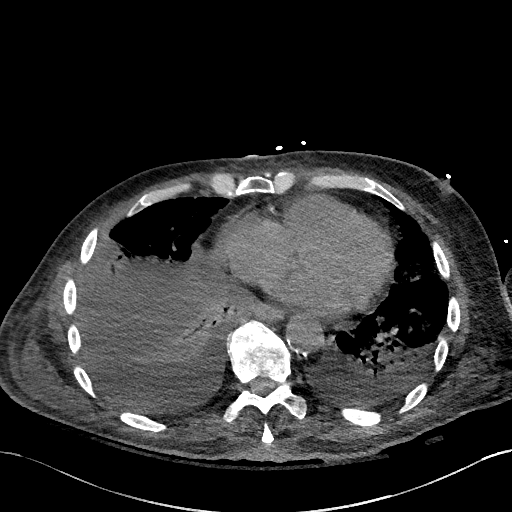
[im 69/187  lung]
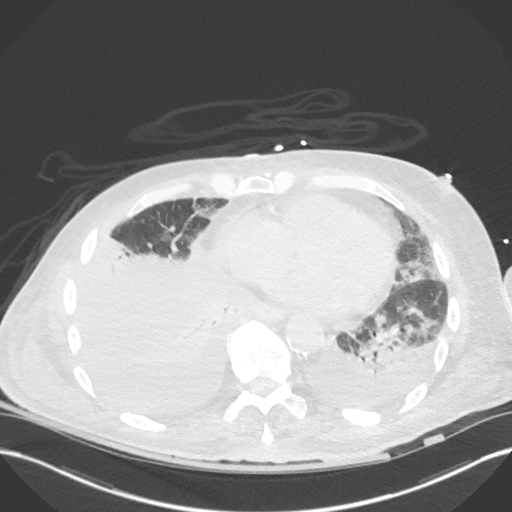
[im 83/187  lung]
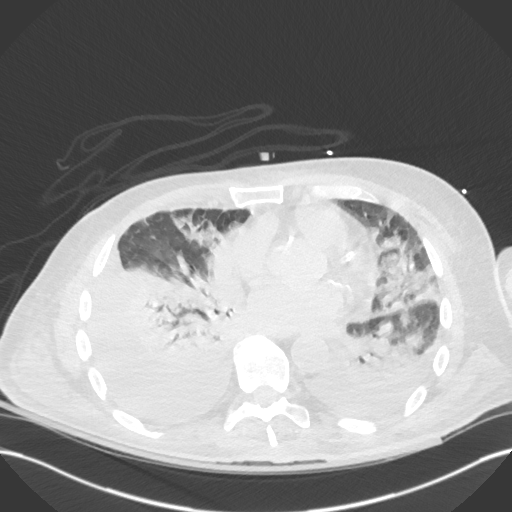
[im 104/187  lung]
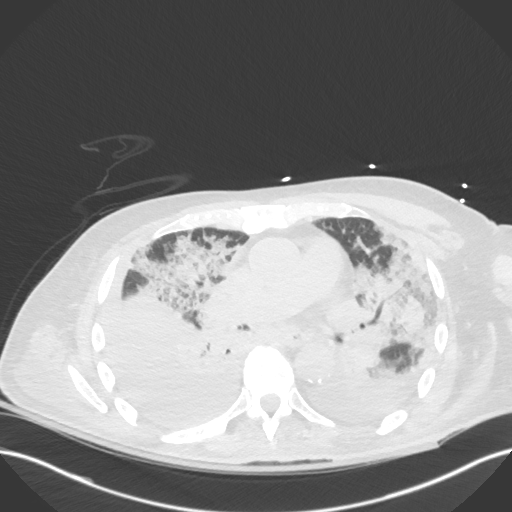
[im 118/187  lung]
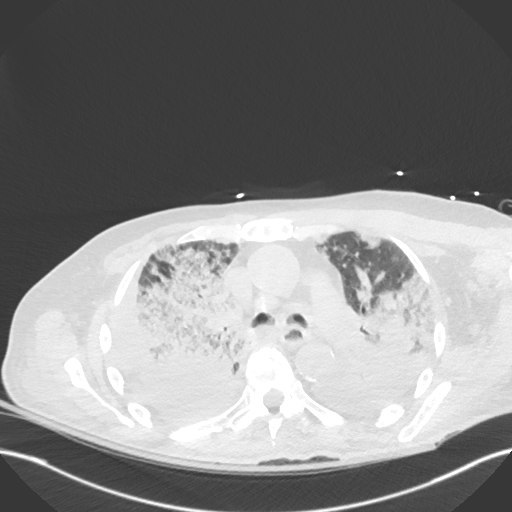
[im 131/187  mediastinal]
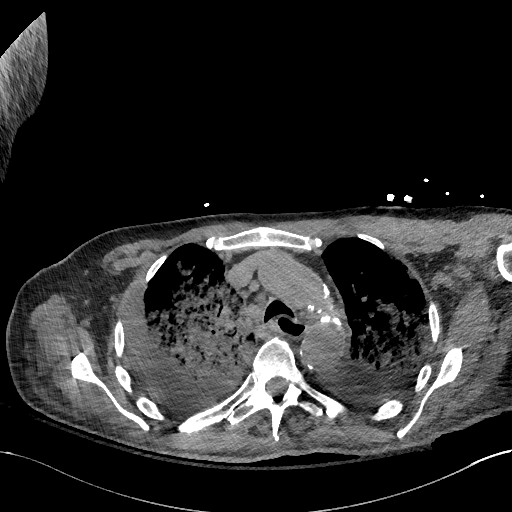
[im 131/187  lung]
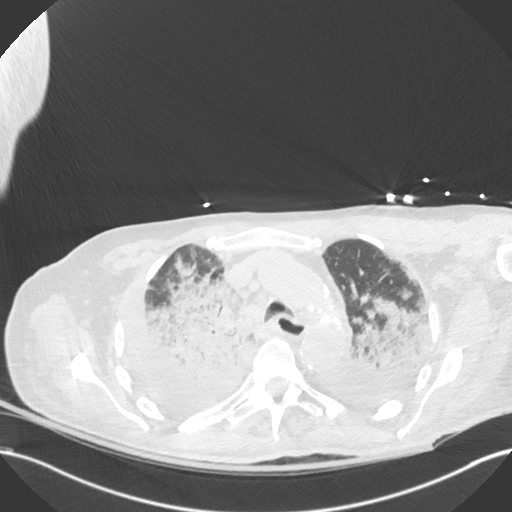
[im 145/187  lung]
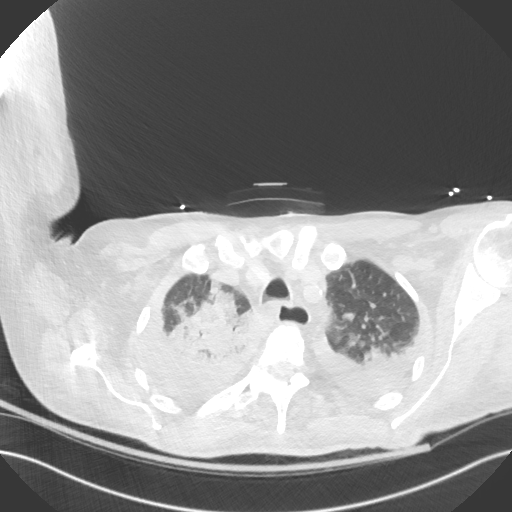
[im 159/187  lung]
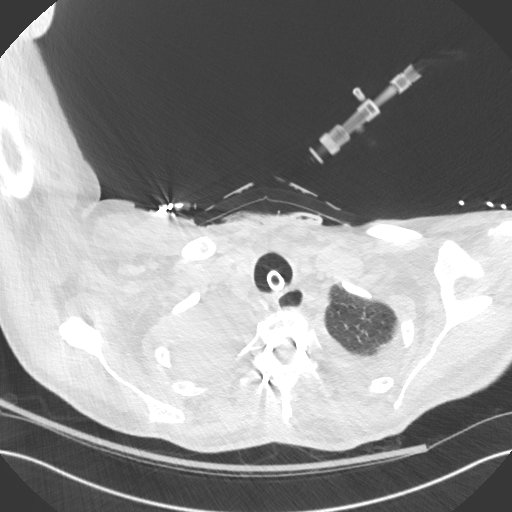
[im 173/187  lung]
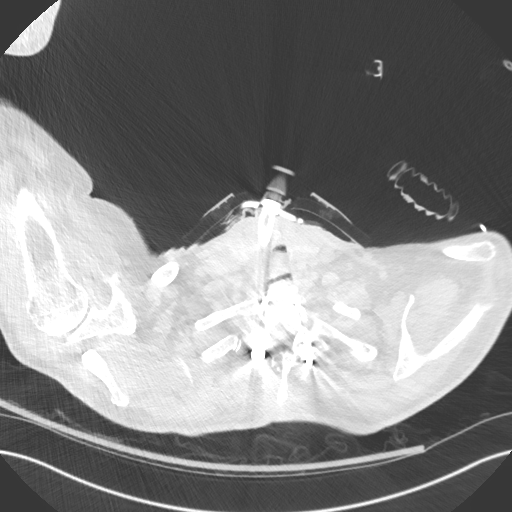

[Series 5: chest w/o 3mm st cor · coronal · non-contrast · 0.73mm/px · 3 of 88 slices shown]
[im 18/88  lung]
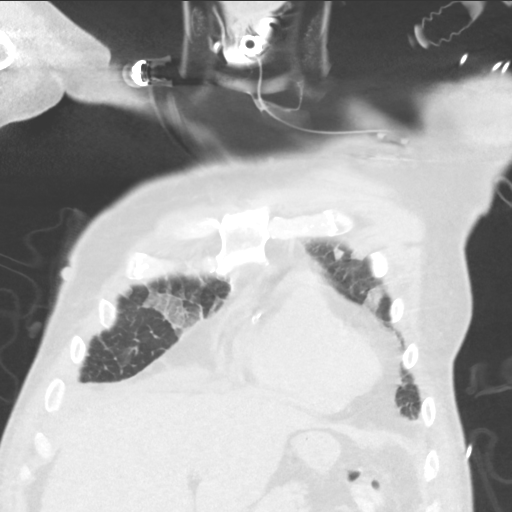
[im 35/88  lung]
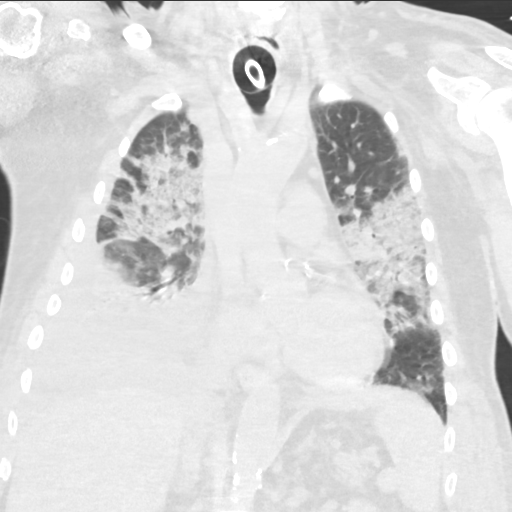
[im 53/88  lung]
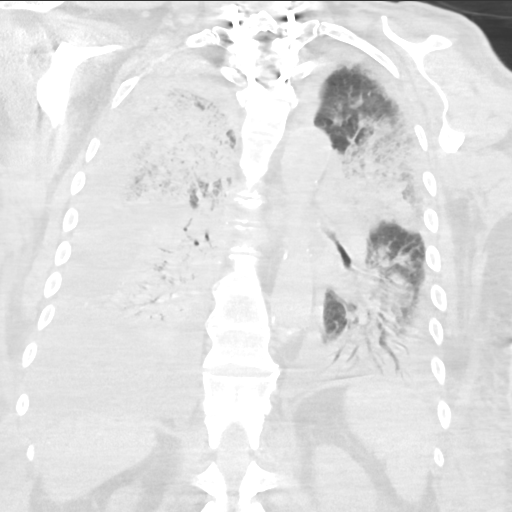

[15 of 36 positions shown; findings below may reference images not displayed]

FINDINGS: Mediastinum/Lymph Nodes: Tracheostomy appears adequately positioned
with tip above the level of the carina. Scattered atherosclerotic
changes noted along the walls of the normal caliber thoracic aorta.
Heart size is upper normal, stable. No pericardial effusion.
Coronary artery calcifications and presumed stent again noted.

Scattered small and moderate-sized lymph nodes noted throughout the
mediastinum. Esophagus is unremarkable. Trachea and central bronchi
are unremarkable.

Lungs/Pleura: Again noted is a moderate-sized right pleural
effusion. Adjacent dense consolidation is presumably compressive
atelectasis. There is a new left pleural effusion which is small to
moderate in size with adjacent atelectasis.

Diffuse consolidations are seen throughout the upper lobes, and to a
lesser degree in the lower lungs bilaterally, majority of which has
a ground-glass density. No pneumothorax seen.

Upper abdomen: No acute findings. Percutaneous gastrostomy tube
appears adequately positioned.

Musculoskeletal: Healing fracture within the sternum. Fixation
hardware incompletely imaged at the cervical thoracic junction.
Stable compression deformity of the T7 vertebral body, of uncertain
age.
IMPRESSION: 1. Right pleural effusion, moderate in size, stable. Adjacent dense
consolidation within the right lower lobe is stable and most likely
compressive atelectasis.
2. New left pleural effusion, small to moderate in size, also with
adjacent compressive atelectasis.
3. Diffuse consolidations throughout the remainder of the lungs,
majority with a ground-glass density. Differential includes atypical
pneumonias such as viral or fungal, interstitial pneumonias, edema
related to volume overload/CHF, sequela of aspiration and ARDS.
4. Numerous small and moderate-sized lymph nodes within the
mediastinum, most likely reactive in nature.
5. Fixation hardware incompletely imaged at the cervical thoracic
junction. Stable mild compression deformity of the T7 vertebral
body, again of uncertain age.
These results will be called to the ordering clinician or
representative by the Radiologist Assistant, and communication
documented in the PACS or zVision Dashboard.

## 2017-05-19 IMAGING — CR DG CHEST 1V PORT
1 series · 1 of 1 positions shown · non-contrast
Comparison: 05/03/2016

CLINICAL DATA: Pneumonia

EXAM:
PORTABLE CHEST 1 VIEW

[AP]
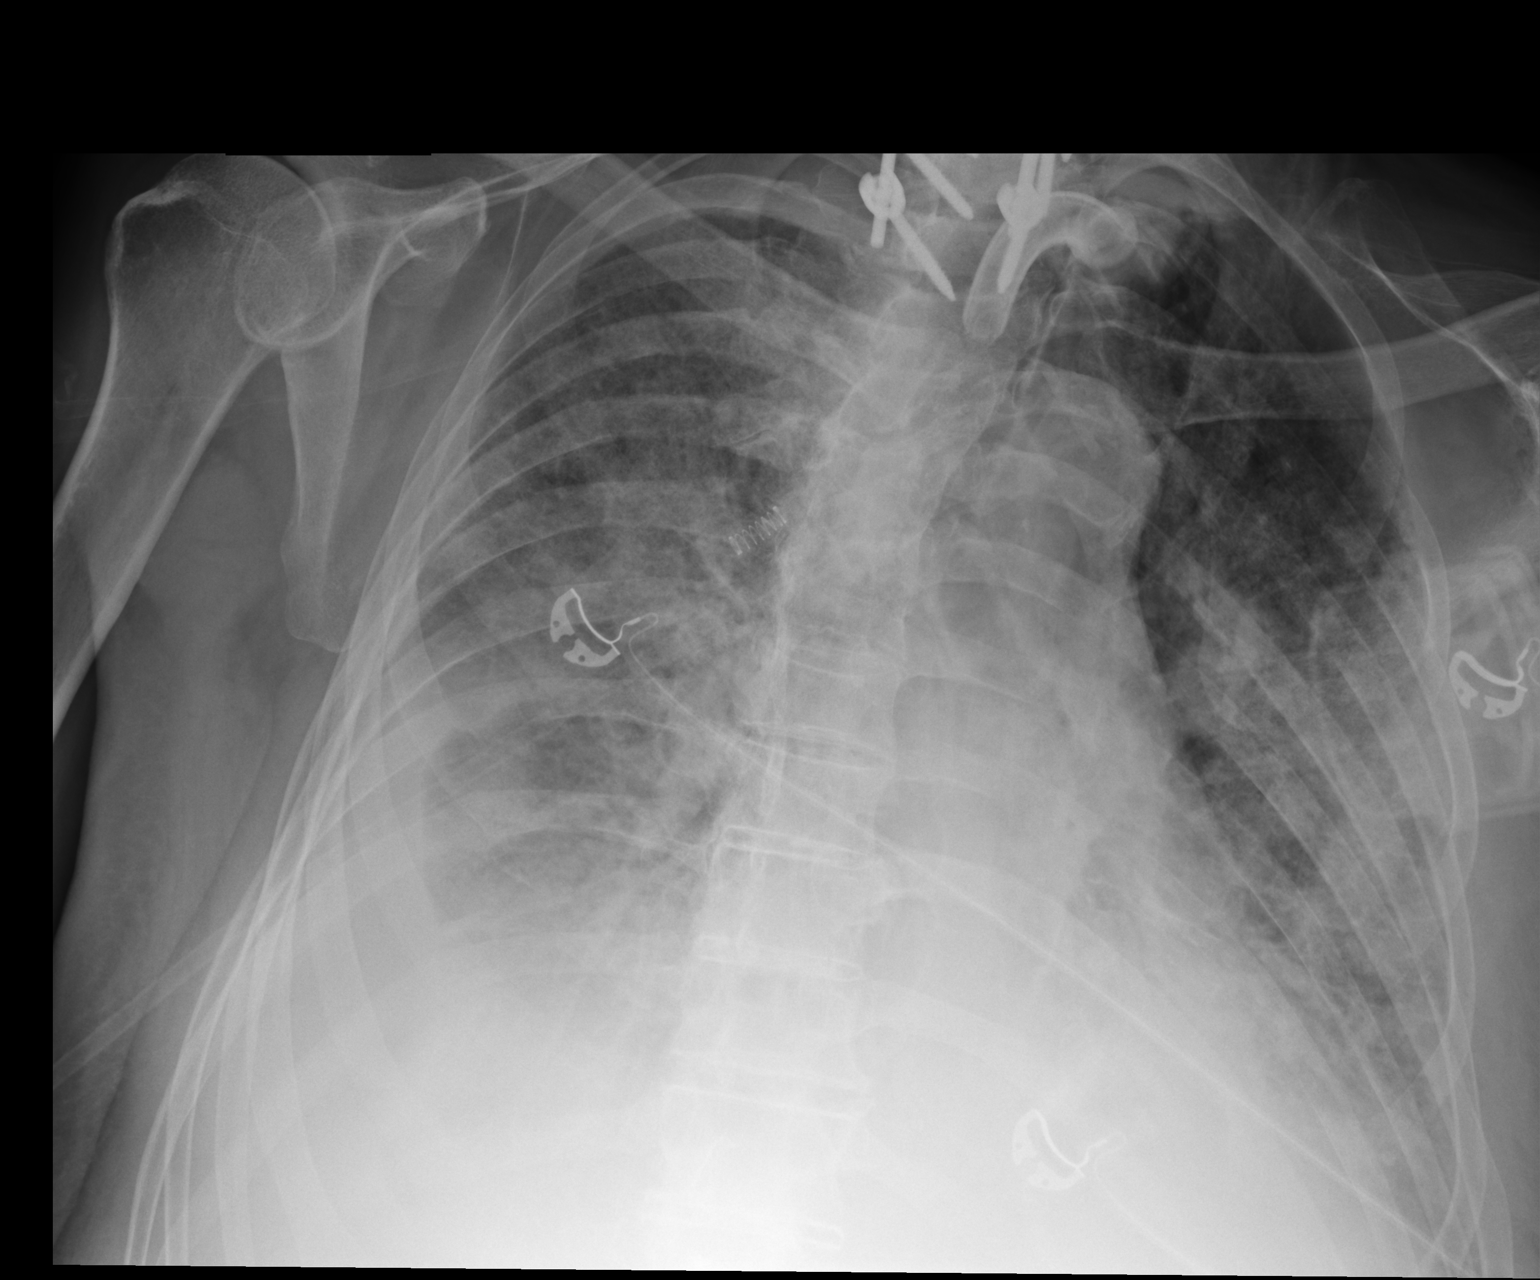

[1 of 1 positions shown; findings below may reference images not displayed]

FINDINGS: Diffuse airspace disease again noted with moderate right pleural
effusion. Suspect small left effusion. No real change since prior
study. Mild cardiomegaly. Tracheostomy tube is unchanged.
IMPRESSION: Stable diffuse bilateral airspace disease and effusions.
